# Patient Record
Sex: Female | Born: 1975 | Race: White | Hispanic: No | Marital: Single | Smoking: Current every day smoker
Health system: Southern US, Community
[De-identification: ages and names within clinical notes are randomized; demographics above are authoritative.]

## PROBLEM LIST (undated history)

## (undated) DIAGNOSIS — K219 Gastro-esophageal reflux disease without esophagitis: Secondary | ICD-10-CM

## (undated) DIAGNOSIS — T7840XA Allergy, unspecified, initial encounter: Secondary | ICD-10-CM

## (undated) DIAGNOSIS — J45909 Unspecified asthma, uncomplicated: Secondary | ICD-10-CM

## (undated) HISTORY — DX: Allergy, unspecified, initial encounter: T78.40XA

## (undated) HISTORY — DX: Unspecified asthma, uncomplicated: J45.909

## (undated) HISTORY — DX: Gastro-esophageal reflux disease without esophagitis: K21.9

---

## 2013-06-15 ENCOUNTER — Ambulatory Visit (INDEPENDENT_AMBULATORY_CARE_PROVIDER_SITE_OTHER): Payer: BC Managed Care – PPO | Admitting: Family Medicine

## 2013-06-15 ENCOUNTER — Other Ambulatory Visit: Payer: Self-pay | Admitting: Family Medicine

## 2013-06-15 ENCOUNTER — Ambulatory Visit: Payer: BC Managed Care – PPO

## 2013-06-15 VITALS — BP 115/82 | HR 99 | Temp 98.6°F | Resp 18 | Ht 67.5 in | Wt 372.0 lb

## 2013-06-15 DIAGNOSIS — R05 Cough: Secondary | ICD-10-CM

## 2013-06-15 DIAGNOSIS — J209 Acute bronchitis, unspecified: Secondary | ICD-10-CM

## 2013-06-15 DIAGNOSIS — R1011 Right upper quadrant pain: Secondary | ICD-10-CM

## 2013-06-15 DIAGNOSIS — R059 Cough, unspecified: Secondary | ICD-10-CM

## 2013-06-15 DIAGNOSIS — D72829 Elevated white blood cell count, unspecified: Secondary | ICD-10-CM

## 2013-06-15 DIAGNOSIS — R110 Nausea: Secondary | ICD-10-CM

## 2013-06-15 LAB — POCT URINALYSIS DIPSTICK
Bilirubin, UA: NEGATIVE
Blood, UA: NEGATIVE
Glucose, UA: NEGATIVE
Leukocytes, UA: NEGATIVE
Nitrite, UA: NEGATIVE
Protein, UA: NEGATIVE
Spec Grav, UA: >=1.03
Urobilinogen, UA: 0.2
pH, UA: 5.5

## 2013-06-15 LAB — POCT UA - MICROSCOPIC ONLY
Casts, Ur, LPF, POC: NEGATIVE
Crystals, Ur, HPF, POC: NEGATIVE
Mucus, UA: POSITIVE
Yeast, UA: NEGATIVE

## 2013-06-15 LAB — POCT CBC
Granulocyte percent: 71.4 %G (ref 37–80)
HCT, POC: 43 % (ref 37.7–47.9)
Hemoglobin: 13.2 g/dL (ref 12.2–16.2)
Lymph, poc: 2.4 (ref 0.6–3.4)
MCH, POC: 28.9 pg (ref 27–31.2)
MCHC: 30.7 g/dL — AB (ref 31.8–35.4)
MCV: 94.2 fL (ref 80–97)
MID (cbc): 0.7 (ref 0–0.9)
MPV: 9.7 fL (ref 0–99.8)
POC Granulocyte: 7.9 — AB (ref 2–6.9)
POC LYMPH PERCENT: 22.2 %L (ref 10–50)
POC MID %: 6.4 %M (ref 0–12)
Platelet Count, POC: 362 10*3/uL (ref 142–424)
RBC: 4.56 M/uL (ref 4.04–5.48)
RDW, POC: 16.3 %
WBC: 11 10*3/uL — AB (ref 4.6–10.2)

## 2013-06-15 MED ORDER — HYDROCODONE-HOMATROPINE 5-1.5 MG/5ML PO SYRP: 5.0000 mL | ORAL_SOLUTION | Freq: Every evening | ORAL | Status: DC | PRN

## 2013-06-15 MED ORDER — BENZONATATE 100 MG PO CAPS: 200.0000 mg | ORAL_CAPSULE | Freq: Two times a day (BID) | ORAL | Status: DC | PRN

## 2013-06-15 MED ORDER — AZITHROMYCIN 250 MG PO TABS: ORAL_TABLET | ORAL | Status: DC

## 2013-06-15 NOTE — Patient Instructions (Signed)

## 2013-06-15 NOTE — Progress Notes (Signed)
[facility]:  Office Visit  Chief Complaint:  Chief Complaint  Patient presents with  . Abdominal Pain    ruq    HPI: Margaret Gomez is a 37 y.o. female who is here for   Cough and chest congestion Saturday, quit smoking 1 week ago No fevers or chills Not really coughing up anything, no sputum prodcution No facial or ear pain.  Today she has acute  RUQ abd pain, dull 7/10 pain, she was sitting at her mom's and it started.  Went over to her apt and just got worse, laid down.  Sweating No nausea, fevers, vomitus, chills, rash, diarrhea, blood in urine or stool. Still having flatus, bm , no diarrhea No recent travels, med She works in nursing home No prior h/o gallstones, dm, or hyperlipidemia Ate roast beef sandwich and two tacos the day before  Past Medical History  Diagnosis Date  . Allergy   . Asthma    No past surgical history on file. History   Social History  . Marital Status: Single    Spouse Name: N/A    Number of Children: N/A  . Years of Education: N/A   Social History Main Topics  . Smoking status: Current Every Day Smoker  . Smokeless tobacco: None  . Alcohol Use: No  . Drug Use: No  . Sexual Activity: No   Other Topics Concern  . None   Social History Narrative  . None   Family History  Problem Relation Age of Onset  . COPD Mother    No Known Allergies Prior to Admission medications   Medication Sig Start Date End Date Taking? Authorizing Provider  albuterol (PROVENTIL HFA;VENTOLIN HFA) 108 (90 BASE) MCG/ACT inhaler Inhale 2 puffs into the lungs every 6 (six) hours as needed for wheezing.   Yes Historical Provider, MD     ROS: The patient denies night sweats, unintentional weight loss, chest pain, palpitations, wheezing, dyspnea on exertion, , vomiting, , dysuria, hematuria, melena, numbness, weakness, or tingling.   All other systems have been reviewed and were otherwise negative with the exception of those  mentioned in the HPI and as above.    PHYSICAL EXAM: Filed Vitals:   06/15/13 1934  BP: 115/82  Pulse: 99  Temp: 98.6 F (37 C)  Resp: 18   Filed Vitals:   06/15/13 1934  Height: 5' 7.5" (1.715 m)  Weight: 372 lb (168.738 kg)   Body mass index is 57.37 kg/(m^2).  General: Alert, no acute distress, obese Caucasian female HEENT:  Normocephalic, atraumatic, oropharynx patent. EOMI, PERRLA Cardiovascular:  Regular rate and rhythm, no rubs murmurs or gallops.  No Carotid bruits, radial pulse intact. No pedal edema.  Respiratory: Clear to auscultation bilaterally.  No wheezes, rales, or rhonchi.  No cyanosis, no use of accessory musculature GI: No organomegaly, abdomen is soft and minimally -Ruq rib cage tenderness, no guarding or rebound, positive bowel sounds.  No masses. Skin: No rashes. Neurologic: Facial musculature symmetric. Psychiatric: Patient is appropriate throughout our interaction. Lymphatic: No cervical lymphadenopathy Musculoskeletal: Gait intact.   LABS: Results for orders placed in visit on 06/15/13  COMPREHENSIVE METABOLIC PANEL      Result Value Range   Sodium 142  135 - 145 mEq/L   Potassium 4.3  3.5 - 5.3 mEq/L   Chloride 106  96 - 112 mEq/L   CO2 28  19 - 32 mEq/L   Glucose, Bld 102 (*) 70 - 99 mg/dL   BUN  10  6 - 23 mg/dL   Creat 1.61  0.96 - 0.45 mg/dL   Total Bilirubin 0.6  0.3 - 1.2 mg/dL   Alkaline Phosphatase 128 (*) 39 - 117 U/L   AST 170 (*) 0 - 37 U/L   ALT 65 (*) 0 - 35 U/L   Total Protein 6.9  6.0 - 8.3 g/dL   Albumin 4.1  3.5 - 5.2 g/dL   Calcium 9.7  8.4 - 40.9 mg/dL  LIPASE      Result Value Range   Lipase 22  0 - 75 U/L  POCT UA - MICROSCOPIC ONLY      Result Value Range   WBC, Ur, HPF, POC 4-5     RBC, urine, microscopic 0-2     Bacteria, U Microscopic 3+     Mucus, UA pos     Epithelial cells, urine per micros 4-6     Crystals, Ur, HPF, POC neg     Casts, Ur, LPF, POC neg     Yeast, UA neg    POCT URINALYSIS DIPSTICK       Result Value Range   Color, UA yellow     Clarity, UA clear     Glucose, UA neg     Bilirubin, UA neg     Ketones, UA trace     Spec Grav, UA >=1.030     Blood, UA neg     pH, UA 5.5     Protein, UA neg     Urobilinogen, UA 0.2     Nitrite, UA neg     Leukocytes, UA Negative    POCT CBC      Result Value Range   WBC 11.0 (*) 4.6 - 10.2 K/uL   Lymph, poc 2.4  0.6 - 3.4   POC LYMPH PERCENT 22.2  10 - 50 %L   MID (cbc) 0.7  0 - 0.9   POC MID % 6.4  0 - 12 %M   POC Granulocyte 7.9 (*) 2 - 6.9   Granulocyte percent 71.4  37 - 80 %G   RBC 4.56  4.04 - 5.48 M/uL   Hemoglobin 13.2  12.2 - 16.2 g/dL   HCT, POC 81.1  91.4 - 47.9 %   MCV 94.2  80 - 97 fL   MCH, POC 28.9  27 - 31.2 pg   MCHC 30.7 (*) 31.8 - 35.4 g/dL   RDW, POC 78.2     Platelet Count, POC 362  142 - 424 K/uL   MPV 9.7  0 - 99.8 fL     EKG/XRAY:   Primary read interpreted by Dr. Conley Rolls at [facility]. No acute cardiopulmonary process No obstruction, no appreciable e/o renal stones No free air   ASSESSMENT/PLAN: Encounter Diagnoses  Name Primary?  . Abdominal pain, right upper quadrant Yes  . Cough   . Acute bronchitis   . Leukocytosis, unspecified   . Nausea alone    Tylenol, Motrin prn for abd pain Hycodan, Z pack for bronchitis Tessalon perles albuterol Labs pending: CMP, Lipase IF sxs worsen will get advance imaging study or go to ER prn  IF abd pain worsens may be ? Possible acute cholecysitis F/u with labs  Spoke with patient regarding labs, elevated liver enzymes, RUQ pain gone but has moved to middle of her stomach, she has had diarrhea and stool looks green vs tan. She feels about the same. Still coughing.  She ahs had Hep B vaccine. Agreed to allow me  to add Acute hepatitis labs.  Gross sideeffects, risk and benefits, and alternatives of medications d/w patient. Patient is aware that all medications have potential sideeffects and we are unable to predict every sideeffect or drug-drug interaction that  may occur.  LE, THAO PHUONG, DO 06/17/2013 10:30 AM

## 2013-06-16 LAB — COMPREHENSIVE METABOLIC PANEL WITH GFR
ALT: 65 U/L — ABNORMAL HIGH (ref 0–35)
AST: 170 U/L — ABNORMAL HIGH (ref 0–37)
Albumin: 4.1 g/dL (ref 3.5–5.2)
Alkaline Phosphatase: 128 U/L — ABNORMAL HIGH (ref 39–117)
BUN: 10 mg/dL (ref 6–23)
CO2: 28 meq/L (ref 19–32)
Calcium: 9.7 mg/dL (ref 8.4–10.5)
Chloride: 106 meq/L (ref 96–112)
Creat: 0.79 mg/dL (ref 0.50–1.10)
Glucose, Bld: 102 mg/dL — ABNORMAL HIGH (ref 70–99)
Potassium: 4.3 meq/L (ref 3.5–5.3)
Sodium: 142 meq/L (ref 135–145)
Total Bilirubin: 0.6 mg/dL (ref 0.3–1.2)
Total Protein: 6.9 g/dL (ref 6.0–8.3)

## 2013-06-16 LAB — LIPASE: Lipase: 22 U/L (ref 0–75)

## 2013-06-17 ENCOUNTER — Telehealth: Payer: Self-pay

## 2013-06-17 LAB — HEPATITIS PANEL, ACUTE
HCV Ab: NEGATIVE
Hep A IgM: NONREACTIVE
Hep B C IgM: NONREACTIVE
Hepatitis B Surface Ag: NEGATIVE

## 2013-06-17 NOTE — Telephone Encounter (Signed)
CT Abd/Pelvis was pre-certed #[id number] and scheduled at [facility] [address] on 06/18/13 at 3:45. Must p/up contrast at least 2 hrs prior and no solid foods for 4 hrs before that. Notified pt of appt and inst's. She agreed. I gave [place]  Img Dr Irwin Brakeman number to call report.

## 2013-06-18 ENCOUNTER — Other Ambulatory Visit: Payer: Self-pay

## 2013-06-18 ENCOUNTER — Ambulatory Visit
Admission: RE | Admit: 2013-06-18 | Discharge: 2013-06-18 | Disposition: A | Discharge status: Home / Self Care | Payer: BC Managed Care – PPO | Source: Ambulatory Visit | Attending: Family Medicine | Admitting: Family Medicine

## 2013-06-18 ENCOUNTER — Telehealth: Payer: Self-pay | Admitting: Radiology

## 2013-06-18 DIAGNOSIS — D72829 Elevated white blood cell count, unspecified: Secondary | ICD-10-CM

## 2013-06-18 DIAGNOSIS — R748 Abnormal levels of other serum enzymes: Secondary | ICD-10-CM

## 2013-06-18 DIAGNOSIS — R109 Unspecified abdominal pain: Secondary | ICD-10-CM

## 2013-06-18 DIAGNOSIS — R7989 Other specified abnormal findings of blood chemistry: Secondary | ICD-10-CM

## 2013-06-18 MED ORDER — IOHEXOL 300 MG/ML  SOLN
125.0000 mL | Freq: Once | INTRAMUSCULAR | Status: AC | PRN
  Administered 2013-06-18: 125 mL via INTRAVENOUS

## 2013-06-18 NOTE — Telephone Encounter (Signed)
Margaret Gomez from [facility] called with the report CT Abdomen / Pelvis - see report in chart.

## 2013-06-19 NOTE — Telephone Encounter (Addendum)
Spoke with patient about CT results She is not having any pain right now, but not feeling better since has not taken any abx for her bronchitis Was afraid of liver enzymes being elevated, she has not taken her meds Advise that CT of abd and acute hepatitis panel normal except for gallstones, not acute chole. IF she has more pain or worsenign sxs then let me know so we can refer her to gen surgery She will let me know if she is worse, she can take her meds I will get repeat liver enzymes in 1 week, labs only visit

## 2013-06-21 ENCOUNTER — Telehealth: Payer: Self-pay

## 2013-06-21 DIAGNOSIS — K8020 Calculus of gallbladder without cholecystitis without obstruction: Secondary | ICD-10-CM

## 2013-06-21 NOTE — Telephone Encounter (Signed)
Lot of pain Throwing up all night  Unable to eat   [id number]

## 2013-06-22 ENCOUNTER — Telehealth: Payer: Self-pay | Admitting: Radiology

## 2013-06-22 NOTE — Telephone Encounter (Signed)
duplicate

## 2013-06-22 NOTE — Telephone Encounter (Signed)
Referral made to general surgery. They will call her, Lupita Leash will try to get this set up soon, patient advised.

## 2013-06-24 ENCOUNTER — Other Ambulatory Visit: Payer: Self-pay

## 2013-06-30 ENCOUNTER — Encounter (INDEPENDENT_AMBULATORY_CARE_PROVIDER_SITE_OTHER): Payer: Self-pay | Admitting: Surgery

## 2013-06-30 ENCOUNTER — Telehealth (INDEPENDENT_AMBULATORY_CARE_PROVIDER_SITE_OTHER): Payer: Self-pay | Admitting: Surgery

## 2013-06-30 ENCOUNTER — Ambulatory Visit (INDEPENDENT_AMBULATORY_CARE_PROVIDER_SITE_OTHER): Payer: BC Managed Care – PPO | Admitting: Surgery

## 2013-06-30 VITALS — BP 130/84 | HR 72 | Temp 98.7°F | Resp 16 | Ht 66.0 in | Wt 366.2 lb

## 2013-06-30 DIAGNOSIS — K8010 Calculus of gallbladder with chronic cholecystitis without obstruction: Secondary | ICD-10-CM

## 2013-06-30 NOTE — Progress Notes (Signed)
Patient ID: Margaret Gomez, female   DOB: [birth year], 37 y.o.   MRN: [id number]  Chief Complaint  Patient presents with  . Abdominal Pain    gallstones    HPI Margaret Gomez is a 37 y.o. female.  Referred by Dr. Hamilton Capri for evaluation of gallstones  Abdominal Pain Associated symptoms: diarrhea, nausea and vomiting   Associated symptoms: no chest pain, no chills, no constipation, no cough, no fever, no hematuria, no sore throat and no vaginal bleeding    This is a 37 yo female with morbid obesity, but a weight loss of 50 lbs over the last several months, that presents after a single episode of severe right upper quadrant pain that radiated across her upper abdomen and around to her back.  This was associated with nausea, vomiting, and diarrhea, as well as abdominal bloating.  The pain resolved after a few days of eating a bland diet, but she has had one mild recurrence after eating a greasy meal.  Currently she is asymptomatic as long as she sticks to a low-fat diet.  She had elevated alkaline phosphatase and AST/ALT, but normal bilirubin.  WBC was also elevated at 11.0.   Past Medical History  Diagnosis Date  . Allergy   . Asthma   . GERD (gastroesophageal reflux disease)   Morbid obesity  History reviewed. No pertinent past surgical history.  Family History  Problem Relation Age of Onset  . COPD Mother     Social History History  Substance Use Topics  . Smoking status: Current Every Day Smoker -- 0.50 packs/day    Types: Cigarettes  . Smokeless tobacco: Not on file  . 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 albuterol (PROVENTIL HFA;VENTOLIN HFA) 108 (90 BASE) MCG/ACT inhaler Inhale 2 puffs into the lungs every 6 (six) hours as needed for wheezing.      Marland Kitchen azithromycin (ZITHROMAX) 250 MG tablet 2 tabs po now then 1 tab po daily for the next 4 days  6  tablet  0  . benzonatate (TESSALON) 100 MG capsule Take 2 capsules (200 mg total) by mouth 2 (two) times daily as needed for cough.  30 capsule  0  . HYDROcodone-homatropine (HYCODAN) 5-1.5 MG/5ML syrup Take 5 mLs by mouth at bedtime as needed for cough.  120 mL  0   No current facility-administered medications for this visit.    Review of Systems Review of Systems  Constitutional: Negative for fever, chills and unexpected weight change.  HENT: Negative for congestion, hearing loss, sore throat, trouble swallowing and voice change.   Eyes: Negative for visual disturbance.  Respiratory: Negative for cough and wheezing.   Cardiovascular: Negative for chest pain, palpitations and leg swelling.  Gastrointestinal: Positive for nausea, vomiting, abdominal pain, diarrhea and abdominal distention. Negative for constipation, blood in stool and anal bleeding.  Genitourinary: Negative for hematuria, vaginal bleeding and difficulty urinating.  Musculoskeletal: Negative for arthralgias.  Skin: Negative for rash and wound.  Neurological: Negative for seizures, syncope and headaches.  Hematological: Negative for adenopathy. Does not bruise/bleed easily.  Psychiatric/Behavioral: Negative for confusion.    Blood pressure 130/84, pulse 72, temperature 98.7 F (37.1 C), temperature source Temporal, resp. rate 16, height 5\' 6"  (1.676 m), weight 366 lb 3.2 oz (166.107 kg), last menstrual period 06/13/2013.  Physical Exam Physical Exam WDWN in NAD HEENT:  EOMI, sclera  anicteric Neck:  No masses, no thyromegaly Lungs:  CTA bilaterally; normal respiratory effort CV:  Regular rate and rhythm; no murmurs Abd:  +bowel sounds, obese, minimal RUQ tenderness; no masses Ext:  Well-perfused; no edema Skin:  Warm, dry; no sign of jaundice  Data Reviewed Ct Abdomen Pelvis W Contrast  06/18/2013   CLINICAL DATA:  Right upper quadrant pain, elevated LFTs, recent weight loss  EXAM: CT ABDOMEN AND PELVIS WITH  CONTRAST  TECHNIQUE: Multidetector CT imaging of the abdomen and pelvis was performed using the standard protocol following bolus administration of intravenous contrast.  CONTRAST:  OMNIPAQUE IOHEXOL 300 MG/ML  SOLN  COMPARISON:  None.  FINDINGS: The lung bases are free of acute infiltrate or sizable effusion.  The liver is homogeneous in attenuation without focal mass. The gallbladder is decompressed but demonstrates findings of cholelithiasis. No pericholecystic fluid is seen. The spleen, adrenal glands and pancreas are within normal limits. A duodenal diverticulum is noted. The appendix is well visualized and within normal limits. The bladder is decompressed. No pelvic mass lesion or sidewall abnormality is noted. The bony structures are within normal limits.  IMPRESSION: Cholelithiasis in a decompressed gallbladder. No acute abnormality is noted.   Electronically Signed   By: Alcide Clever M.D.   On: 06/18/2013 16:30   Dg Abd Acute W/chest  06/15/2013   CLINICAL DATA:  abdominal pain  EXAM: ACUTE ABDOMEN SERIES (ABDOMEN 2 VIEW & CHEST 1 VIEW)  COMPARISON:  None.  FINDINGS: PA chest: There is subsegmental atelectasis in the left base. There is no edema or consolidation. Heart size and pulmonary vascularity are normal. No adenopathy.  Supine and upright abdomen: There is moderate stool throughout the colon. The bowel gas pattern is unremarkable. No obstruction or free air is appreciated on this study. Liver and spleen appear prominent. There are apparent phleboliths in the pelvis.  IMPRESSION: Prominent liver and spleen. Bowel gas pattern unremarkable. No edema or consolidation.   Electronically Signed   By: Bretta Bang M.D.   On: 06/15/2013 21:56    Lab Results  Component Value Date   WBC 11.0* 06/15/2013   HGB 13.2 06/15/2013   HCT 43.0 06/15/2013   MCV 94.2 06/15/2013   Lab Results  Component Value Date   CREATININE 0.79 06/15/2013   BUN 10 06/15/2013   NA 142 06/15/2013   K 4.3  06/15/2013   CL 106 06/15/2013   CO2 28 06/15/2013   Lab Results  Component Value Date   ALT 65* 06/15/2013   AST 170* 06/15/2013   ALKPHOS 128* 06/15/2013   BILITOT 0.6 06/15/2013   Lab Results  Component Value Date   LIPASE 22 06/15/2013     Assessment    Chronic calculus cholecystitis - possible choledocholithiasis, but currently no signs of CBD obstruction     Plan    Laparoscopic cholecystectomy with intraoperative cholangiogram. The surgical procedure has been discussed with the patient.  Potential risks, benefits, alternative treatments, and expected outcomes have been explained.  All of the patient's questions at this time have been answered.  The likelihood of reaching the patient's treatment goal is good.  The patient understand the proposed surgical procedure and wishes to proceed.         Briston Lax K. 06/30/2013, 3:40 PM

## 2013-06-30 NOTE — Telephone Encounter (Signed)
Pt will call back when can schedule surgery / made aware of [facility] financial obligation  High deductible insurance plan /

## 2014-03-19 ENCOUNTER — Ambulatory Visit (INDEPENDENT_AMBULATORY_CARE_PROVIDER_SITE_OTHER): Payer: BC Managed Care – PPO | Admitting: Family Medicine

## 2014-03-19 DIAGNOSIS — R748 Abnormal levels of other serum enzymes: Secondary | ICD-10-CM

## 2014-03-19 LAB — COMPREHENSIVE METABOLIC PANEL
ALT: 12 U/L (ref 0–35)
AST: 11 U/L (ref 0–37)
Albumin: 4 g/dL (ref 3.5–5.2)
Alkaline Phosphatase: 70 U/L (ref 39–117)
BUN: 9 mg/dL (ref 6–23)
CALCIUM: 9.1 mg/dL (ref 8.4–10.5)
CO2: 23 mEq/L (ref 19–32)
CREATININE: 0.64 mg/dL (ref 0.50–1.10)
Chloride: 106 mEq/L (ref 96–112)
Glucose, Bld: 87 mg/dL (ref 70–99)
Potassium: 4.2 mEq/L (ref 3.5–5.3)
Sodium: 138 mEq/L (ref 135–145)
TOTAL PROTEIN: 6.4 g/dL (ref 6.0–8.3)
Total Bilirubin: 0.5 mg/dL (ref 0.2–1.2)

## 2014-03-19 LAB — LIPID PANEL
Cholesterol: 164 mg/dL (ref 0–200)
HDL: 44 mg/dL (ref >39)
LDL CALC: 102 mg/dL — AB (ref 0–99)
TRIGLYCERIDES: 92 mg/dL (ref <150)
Total CHOL/HDL Ratio: 3.7 Ratio
VLDL: 18 mg/dL (ref 0–40)

## 2014-03-19 LAB — TSH: TSH: 2.245 u[IU]/mL (ref 0.350–4.500)

## 2014-03-19 NOTE — Patient Instructions (Addendum)
Work hard on weight loss and exercise as discussed  Return for a followup in 4-6 months.

## 2014-03-19 NOTE — Progress Notes (Signed)
Subjective: Patient was here 9 months ago and had some labs done. There was some liver enzyme elevation at that time. She is morbidly obese and has been trying to work at that. She would like to have physician recommendation regarding her weight loss and exercise. She does have access to a gym membership and her trainer is a dietitian also.  Objective: Morbidly obese. Chest clear. Heart regular without murmurs. No thyromegaly.  Assessment: Morbid obesity History of elevated liver enzymes  Plan: Right a letter of reference instructing her to work on the exercise and weight loss. This is intended to help her cover the cost of the exercise in program.  Will check lab work due to her previous abnormal labs.  Return as needed

## 2014-03-20 ENCOUNTER — Encounter: Payer: Self-pay | Admitting: Family Medicine

## 2014-03-21 ENCOUNTER — Encounter: Payer: Self-pay | Admitting: *Deleted

## 2014-08-28 ENCOUNTER — Ambulatory Visit (INDEPENDENT_AMBULATORY_CARE_PROVIDER_SITE_OTHER): Payer: BLUE CROSS/BLUE SHIELD | Admitting: Emergency Medicine

## 2014-08-28 ENCOUNTER — Ambulatory Visit (INDEPENDENT_AMBULATORY_CARE_PROVIDER_SITE_OTHER): Payer: BLUE CROSS/BLUE SHIELD

## 2014-08-28 VITALS — BP 158/82 | HR 93 | Temp 98.2°F | Resp 20 | Ht 66.0 in | Wt 374.0 lb

## 2014-08-28 DIAGNOSIS — R062 Wheezing: Secondary | ICD-10-CM

## 2014-08-28 DIAGNOSIS — R059 Cough, unspecified: Secondary | ICD-10-CM

## 2014-08-28 DIAGNOSIS — J189 Pneumonia, unspecified organism: Secondary | ICD-10-CM

## 2014-08-28 DIAGNOSIS — R05 Cough: Secondary | ICD-10-CM

## 2014-08-28 LAB — POCT CBC
Granulocyte percent: 61 %G (ref 37–80)
HEMATOCRIT: 42.8 % (ref 37.7–47.9)
Hemoglobin: 13.2 g/dL (ref 12.2–16.2)
Lymph, poc: 3.8 — AB (ref 0.6–3.4)
MCH, POC: 28.3 pg (ref 27–31.2)
MCHC: 30.8 g/dL — AB (ref 31.8–35.4)
MCV: 91.9 fL (ref 80–97)
MID (CBC): 0.3 (ref 0–0.9)
MPV: 8.5 fL (ref 0–99.8)
POC GRANULOCYTE: 6.5 (ref 2–6.9)
POC LYMPH PERCENT: 35.8 %L (ref 10–50)
POC MID %: 3.2 %M (ref 0–12)
Platelet Count, POC: 279 10*3/uL (ref 142–424)
RBC: 4.65 M/uL (ref 4.04–5.48)
RDW, POC: 16.5 %
WBC: 10.6 10*3/uL — AB (ref 4.6–10.2)

## 2014-08-28 MED ORDER — ALBUTEROL SULFATE HFA 108 (90 BASE) MCG/ACT IN AERS: 2.0000 | INHALATION_SPRAY | RESPIRATORY_TRACT | Status: AC | PRN

## 2014-08-28 MED ORDER — AZITHROMYCIN 250 MG PO TABS: ORAL_TABLET | ORAL | Status: DC

## 2014-08-28 MED ORDER — ALBUTEROL SULFATE (2.5 MG/3ML) 0.083% IN NEBU
2.5000 mg | INHALATION_SOLUTION | Freq: Once | RESPIRATORY_TRACT | Status: AC
Start: 2014-08-28 — End: 2014-08-28
  Administered 2014-08-28: 2.5 mg via RESPIRATORY_TRACT

## 2014-08-28 MED ORDER — BENZONATATE 100 MG PO CAPS: 100.0000 mg | ORAL_CAPSULE | Freq: Three times a day (TID) | ORAL | Status: DC | PRN

## 2014-08-28 MED ORDER — OMEPRAZOLE 40 MG PO CPDR: 40.0000 mg | DELAYED_RELEASE_CAPSULE | Freq: Every day | ORAL | Status: DC

## 2014-08-28 NOTE — Progress Notes (Addendum)
Subjective:  This chart was scribed for Earl LitesSteve Raylyn Carton, MD by Haywood PaoNadim Abu Hashem, ED Scribe at [facility].The patient was seen in exam room 09 and the patient's care was started at 1:35 PM.   Patient ID: Margaret Gomez, female    DOB: 05/30/1976, 39 y.o.   MRN: [id number] Chief Complaint  Patient presents with  . Cough  . Shortness of Breath  . Nasal Congestion   HPI HPI Comments: Margaret Gomez is a 39 y.o. female [facility] complaining of a non productive dry cough, onset 2 weeks ago. Since onset her symptoms have progressively worsened. Pt initially thought this was caused by her allergies. She has intermittent sore throat, chest congestions, SOB and wheezing, acid reflux more than normal. She has used mucinex, and her inhaler for relief. She last used her inhaler at 10:30 AM today. Pt reports the last few days her inhaler has not helped. 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 Pt denies ear pain and fever.  Patient Active Problem List   Diagnosis Date Noted  . Chronic cholecystitis with calculus 06/30/2013   Past Medical History  Diagnosis Date  . Allergy   . Asthma   . GERD (gastroesophageal reflux disease)    History reviewed. No pertinent past surgical history. No Known Allergies Prior to Admission medications   Medication Sig Start Date End Date Taking? Authorizing Provider  albuterol (PROVENTIL HFA;VENTOLIN HFA) 108 (90 BASE) MCG/ACT inhaler Inhale 2 puffs into the lungs every 6 (six) hours as needed for wheezing.   Yes Historical Provider, MD  Multiple Vitamins-Minerals (MULTI COMPLETE PO) Take by mouth.   Yes Historical Provider, MD  Naproxen Sodium (ALEVE) 220 MG CAPS Take by mouth.   Yes Historical Provider, MD  azithromycin (ZITHROMAX) 250 MG tablet 2 tabs po now then 1 tab po daily for the next 4 days Patient not taking: Reported on 08/28/2014 06/15/13   Thao P Le, DO  benzonatate (TESSALON) 100 MG  capsule Take 2 capsules (200 mg total) by mouth 2 (two) times daily as needed for cough. Patient not taking: Reported on 08/28/2014 06/15/13   Thao P Le, DO  HYDROcodone-homatropine (HYCODAN) 5-1.5 MG/5ML syrup Take 5 mLs by mouth at bedtime as needed for cough. Patient not taking: Reported on 08/28/2014 06/15/13   Thao P Le, DO  OVER THE COUNTER MEDICATION Take 2 tablets by mouth daily. hydroxycut    Historical Provider, MD   History   Social History  . Marital Status: Single    Spouse Name: N/A    Number of Children: N/A  . Years of Education: N/A   Occupational History  . Not on file.   Social History Main Topics  . Smoking status: Current Every Day Smoker -- 0.50 packs/day    Types: Cigarettes  . Smokeless tobacco: Not on file  . Alcohol Use: No  . Drug Use: No  . Sexual Activity: No   Other Topics Concern  . Not on file   Social History Narrative   Review of Systems  Constitutional: Negative for fever.  HENT: Positive for congestion and sore throat. Negative for ear pain.   Respiratory: Positive for shortness of breath and wheezing.        Objective:  BP 158/82 mmHg  Pulse 93  Temp(Src) 98.2 F (36.8 C) (Oral)  Resp 20  Ht 5\' 6"  (1.676 m)  Wt 374 lb (169.645 kg)  BMI  60.39 kg/m2  SpO2 98%  Physical Exam  Constitutional: She is oriented to person, place, and time. She appears well-developed and well-nourished. No distress.  Alert, cooperative and morbidly obese.  HENT:  Head: Normocephalic and atraumatic.  Eyes: EOM are normal.  Neck: Normal range of motion.  Cardiovascular: Normal rate.   Pulmonary/Chest: Effort normal.  Croup like cough  Neurological: She is alert and oriented to person, place, and time. No cranial nerve deficit. She exhibits normal muscle tone. Coordination normal.  Skin: Skin is warm and dry.  Psychiatric: She has a normal mood and affect. Her behavior is normal.  Nursing note and vitals reviewed. [facility] reading (PRIMARY) by  Dr. Cleta Alberts  there appears to be a infiltrate in the left lower lobe with fluid in the fissure. This is best seen on the lateral. Results for orders placed or performed in visit on 08/28/14  POCT CBC  Result Value Ref Range   WBC 10.6 (A) 4.6 - 10.2 K/uL   Lymph, poc 3.8 (A) 0.6 - 3.4   POC LYMPH PERCENT 35.8 10 - 50 %L   MID (cbc) 0.3 0 - 0.9   POC MID % 3.2 0 - 12 %M   POC Granulocyte 6.5 2 - 6.9   Granulocyte percent 61.0 37 - 80 %G   RBC 4.65 4.04 - 5.48 M/uL   Hemoglobin 13.2 12.2 - 16.2 g/dL   HCT, POC 16.1 09.6 - 47.9 %   MCV 91.9 80 - 97 fL   MCH, POC 28.3 27 - 31.2 pg   MCHC 30.8 (A) 31.8 - 35.4 g/dL   RDW, POC 04.5 %   Platelet Count, POC 279 142 - 424 K/uL   MPV 8.5 0 - 99.8 fL       Assessment & Plan:  Peak flow before nebulizer was 350; should be 370. Chest x-ray shows a left lower lobe pneumonia. s. Repeat chest x-ray in 14 days. She is placed on a Z-Pak along with Tessalon Perles. She will also have Prilosec 40 mg 1 a day. Her albuterol was refilled..I personally performed the services described in this documentation, which was scribed in my presence. The recorded information has been reviewed and is accurate.

## 2014-09-01 ENCOUNTER — Encounter: Payer: Self-pay | Admitting: Emergency Medicine

## 2014-10-25 ENCOUNTER — Ambulatory Visit (INDEPENDENT_AMBULATORY_CARE_PROVIDER_SITE_OTHER): Payer: BLUE CROSS/BLUE SHIELD | Admitting: Family Medicine

## 2014-10-25 ENCOUNTER — Ambulatory Visit (INDEPENDENT_AMBULATORY_CARE_PROVIDER_SITE_OTHER): Payer: BLUE CROSS/BLUE SHIELD

## 2014-10-25 VITALS — BP 142/86 | HR 106 | Temp 98.8°F | Resp 18 | Ht 67.5 in | Wt 374.8 lb

## 2014-10-25 DIAGNOSIS — J4530 Mild persistent asthma, uncomplicated: Secondary | ICD-10-CM | POA: Diagnosis not present

## 2014-10-25 DIAGNOSIS — R059 Cough, unspecified: Secondary | ICD-10-CM

## 2014-10-25 DIAGNOSIS — J3489 Other specified disorders of nose and nasal sinuses: Secondary | ICD-10-CM

## 2014-10-25 DIAGNOSIS — R05 Cough: Secondary | ICD-10-CM

## 2014-10-25 DIAGNOSIS — R0600 Dyspnea, unspecified: Secondary | ICD-10-CM | POA: Diagnosis not present

## 2014-10-25 DIAGNOSIS — J019 Acute sinusitis, unspecified: Secondary | ICD-10-CM | POA: Diagnosis not present

## 2014-10-25 MED ORDER — AMOXICILLIN-POT CLAVULANATE 875-125 MG PO TABS: 1.0000 | ORAL_TABLET | Freq: Two times a day (BID) | ORAL | Status: DC

## 2014-10-25 MED ORDER — BECLOMETHASONE DIPROPIONATE 80 MCG/ACT IN AERS: 1.0000 | INHALATION_SPRAY | Freq: Two times a day (BID) | RESPIRATORY_TRACT | Status: AC

## 2014-10-25 NOTE — Progress Notes (Signed)
Subjective:  This chart was scribed for Shade FloodJeffrey R Osiel Stick, MD by Charline BillsEssence Howell, ED Scribe. The patient was seen in room 9. Patient's care was started at 11:04 AM.   Patient ID: Margaret Gomez, female    DOB: 05/20/1976, 39 y.o.   MRN: [id number]  Chief Complaint  Patient presents with  . Sinusitis    3 days  . Dizziness    feels like there is fluid in her ears  . Cough    productive- Yellow/green   HPI HPI Comments: Margaret Gomez is a 39 y.o. female, with a h/o asthma, GERD, who presents to the [facility] complaining of persistent productive cough for the past 4 days. Pt was seen for L lower lobe pneumonia on 08/28/14 and treated with zpak, Tessalon Perles and Prilosec for heartburn. Also given albuterol PRN. Follow-up email 4 days later advised pt to return to clinic if not improved back in January.   Today, pt presents with persistent productive cough with yellow/green sputum for the past 4 days. She reports associated sinus pressure, congestion, dizziness, worsened SOB with walking, wheezing. She denies fever. Pt has been treating with TheraFlu and albuterol inhaler x3 daily for 4 days, x1 daily prior. Pt reports that her children have been sick with similar symptoms and her daughter was recently diagnosed with laryngitis.   PCP: Rockne CoonsLE, THAO PHUONG, DO  Patient Active Problem List   Diagnosis Date Noted  . Chronic cholecystitis with calculus 06/30/2013   Past Medical History  Diagnosis Date  . Allergy   . Asthma   . GERD (gastroesophageal reflux disease)    No past surgical history on file. No Known Allergies Prior to Admission medications   Medication Sig Start Date End Date Taking? Authorizing Provider  albuterol (PROVENTIL HFA;VENTOLIN HFA) 108 (90 BASE) MCG/ACT inhaler Inhale 2 puffs into the lungs every 4 (four) hours as needed for wheezing or shortness of breath (cough, shortness of breath or wheezing.). 08/28/14  Yes Collene GobbleSteven A Daub, MD    Multiple Vitamins-Minerals (MULTI COMPLETE PO) Take by mouth.   Yes Historical Provider, MD  Naproxen Sodium (ALEVE) 220 MG CAPS Take by mouth.   Yes Historical Provider, MD  omeprazole (PRILOSEC) 40 MG capsule Take 1 capsule (40 mg total) by mouth daily. 08/28/14  Yes Collene GobbleSteven A Daub, MD  albuterol (PROVENTIL HFA;VENTOLIN HFA) 108 (90 BASE) MCG/ACT inhaler Inhale 2 puffs into the lungs every 6 (six) hours as needed for wheezing.    Historical Provider, MD  benzonatate (TESSALON) 100 MG capsule Take 1-2 capsules (100-200 mg total) by mouth 3 (three) times daily as needed for cough. Patient not taking: Reported on 10/25/2014 08/28/14   Collene GobbleSteven A Daub, MD  HYDROcodone-homatropine [facility](HYCODAN) 5-1.5 MG/5ML syrup Take 5 mLs by mouth at bedtime as needed for cough. Patient not taking: Reported on 08/28/2014 06/15/13   Thao P Le, DO  OVER THE COUNTER MEDICATION Take 2 tablets by mouth daily. hydroxycut    Historical Provider, MD   History   Social History  . Marital Status: Single    Spouse Name: N/A  . Number of Children: N/A  . Years of Education: N/A   Occupational History  . Not on file.   Social History Main Topics  . Smoking status: Current Every Day Smoker -- 0.50 packs/day    Types: Cigarettes  . Smokeless tobacco: Not on file  . Alcohol Use: No  . Drug Use: No  . Sexual Activity: No  Other Topics Concern  . Not on file   Social History Narrative   Review of Systems  Constitutional: Negative for fever.  HENT: Positive for congestion and sinus pressure.   Respiratory: Positive for cough, shortness of breath and wheezing.   Neurological: Positive for dizziness.      Objective:   Physical Exam  Constitutional: She is oriented to person, place, and time. She appears well-developed and well-nourished. No distress.  HENT:  Head: Normocephalic and atraumatic.  Right Ear: Hearing, tympanic membrane, external ear and ear canal normal.  Left Ear: Hearing, tympanic membrane, external  ear and ear canal normal.  Nose: Right sinus exhibits maxillary sinus tenderness (mild). Right sinus exhibits no frontal sinus tenderness. Left sinus exhibits maxillary sinus tenderness (mild). Left sinus exhibits no frontal sinus tenderness.  Mouth/Throat: Oropharynx is clear and moist. No oropharyngeal exudate.  Eyes: Conjunctivae and EOM are normal. Pupils are equal, round, and reactive to light.  Cardiovascular: Normal rate, regular rhythm, normal heart sounds and intact distal pulses.  Exam reveals no gallop and no friction rub.   No murmur heard. Pulmonary/Chest: Effort normal. No respiratory distress. She has no wheezes. She has no rhonchi.  Distant breath sounds.   Lymphadenopathy:    She has no cervical adenopathy.  Neurological: She is alert and oriented to person, place, and time.  Skin: Skin is warm and dry. No rash noted.  Psychiatric: She has a normal mood and affect. Her behavior is normal.  Vitals reviewed.  Filed Vitals:   10/25/14 1007  BP: 142/86  Pulse: 106  Temp: 98.8 F (37.1 C)  TempSrc: Oral  Resp: 18  Height: 5' 7.5" (1.715 m)  Weight: 374 lb 12.8 oz (170.008 kg)  SpO2: 98%   [facility] reading (PRIMARY) by  Dr. Neva Seat: CXR- Few increased perihilar markings without discrete infiltrate. No acute findings.    Assessment & Plan:   Margaret Gomez is a 39 y.o. female Asthma, mild persistent, uncomplicated - Plan: beclomethasone (QVAR) 80 MCG/ACT inhaler, DG Chest 2 View  Cough - Plan: DG Chest 2 View, amoxicillin-clavulanate (AUGMENTIN) 875-125 MG per tablet  Sinus pressure - Plan: DG Chest 2 View, amoxicillin-clavulanate (AUGMENTIN) 875-125 MG per tablet  Dyspnea - Plan: DG Chest 2 View  Acute sinusitis, recurrence not specified, unspecified location - Plan: amoxicillin-clavulanate (AUGMENTIN) 875-125 MG per tablet  Suspected URi with sinus pressure, PND and cough. Underlying asthma with persistent use of albuterol even prior to current illness.  Prior  PNA, but not seen on XR today.   -add Qvar for asthma, cont albuterol if needed - but clear today. - recheck asthma in 1 month  -sx care with saline ns, mucinex, but if not improving sinus sx's this week - fill Rx Augmentin.    -rtc/er precautions given if worse.   Meds ordered this encounter  Medications  . beclomethasone (QVAR) 80 MCG/ACT inhaler    Sig: Inhale 1 puff into the lungs 2 (two) times daily.    Dispense:  1 Inhaler    Refill:  3  . amoxicillin-clavulanate (AUGMENTIN) 875-125 MG per tablet    Sig: Take 1 tablet by mouth 2 (two) times daily.    Dispense:  20 tablet    Refill:  0   Patient Instructions  Start Qvar for asthma symptoms. Follow up with Dr. Conley Rolls in next month. Albuterol if needed for wheezing - return here if having to use albuterol persistently more than 2-3 times per day.  Saline nasal spray atleast 4  times per day, over the counter mucinex or mucinex DM (IF this causes more wheezing - stop mucinex), drink plenty of fluids.  If sinus symptoms not improving in next few days - can start augmentin as prescribed. Return to the clinic or go to the nearest emergency room if any of your symptoms worsen or new symptoms occur.  Upper Respiratory Infection, Adult An upper respiratory infection (URI) is also sometimes known as the common cold. The upper respiratory tract includes the nose, sinuses, throat, trachea, and bronchi. Bronchi are the airways leading to the lungs. Most people improve within 1 week, but symptoms can last up to 2 weeks. A residual cough may last even longer.  CAUSES Many different viruses can infect the tissues lining the upper respiratory tract. The tissues become irritated and inflamed and often become very moist. Mucus production is also common. A cold is contagious. You can easily spread the virus to others by oral contact. This includes kissing, sharing a glass, coughing, or sneezing. Touching your mouth or nose and then touching a surface, which is  then touched by another person, can also spread the virus. SYMPTOMS  Symptoms typically develop 1 to 3 days after you come in contact with a cold virus. Symptoms vary from person to person. They may include:  Runny nose.  Sneezing.  Nasal congestion.  Sinus irritation.  Sore throat.  Loss of voice (laryngitis).  Cough.  Fatigue.  Muscle aches.  Loss of appetite.  Headache.  Low-grade fever. DIAGNOSIS  You might diagnose your own cold based on familiar symptoms, since most people get a cold 2 to 3 times a year. Your caregiver can confirm this based on your exam. Most importantly, your caregiver can check that your symptoms are not due to another disease such as strep throat, sinusitis, pneumonia, asthma, or epiglottitis. Blood tests, throat tests, and X-rays are not necessary to diagnose a common cold, but they may sometimes be helpful in excluding other more serious diseases. Your caregiver will decide if any further tests are required. RISKS AND COMPLICATIONS  You may be at risk for a more severe case of the common cold if you smoke cigarettes, have chronic heart disease (such as heart failure) or lung disease (such as asthma), or if you have a weakened immune system. The very young and very old are also at risk for more serious infections. Bacterial sinusitis, middle ear infections, and bacterial pneumonia can complicate the common cold. The common cold can worsen asthma and chronic obstructive pulmonary disease (COPD). Sometimes, these complications can require emergency medical care and may be life-threatening. PREVENTION  The best way to protect against getting a cold is to practice good hygiene. Avoid oral or hand contact with people with cold symptoms. Wash your hands often if contact occurs. There is no clear evidence that vitamin C, vitamin E, echinacea, or exercise reduces the chance of developing a cold. However, it is always recommended to get plenty of rest and practice  good nutrition. TREATMENT  Treatment is directed at relieving symptoms. There is no cure. Antibiotics are not effective, because the infection is caused by a virus, not by bacteria. Treatment may include:  Increased fluid intake. Sports drinks offer valuable electrolytes, sugars, and fluids.  Breathing heated mist or steam (vaporizer or shower).  Eating chicken soup or other clear broths, and maintaining good nutrition.  Getting plenty of rest.  Using gargles or lozenges for comfort.  Controlling fevers with ibuprofen or acetaminophen as directed by your caregiver.  Increasing usage of your inhaler if you have asthma. Zinc gel and zinc lozenges, taken in the first 24 hours of the common cold, can shorten the duration and lessen the severity of symptoms. Pain medicines may help with fever, muscle aches, and throat pain. A variety of non-prescription medicines are available to treat congestion and runny nose. Your caregiver can make recommendations and may suggest nasal or lung inhalers for other symptoms.  HOME CARE INSTRUCTIONS   Only take over-the-counter or prescription medicines for pain, discomfort, or fever as directed by your caregiver.  Use a warm mist humidifier or inhale steam from a shower to increase air moisture. This may keep secretions moist and make it easier to breathe.  Drink enough water and fluids to keep your urine clear or pale yellow.  Rest as needed.  Return to work when your temperature has returned to normal or as your caregiver advises. You may need to stay home longer to avoid infecting others. You can also use a face mask and careful hand washing to prevent spread of the virus. SEEK MEDICAL CARE IF:   After the first few days, you feel you are getting worse rather than better.  You need your caregiver's advice about medicines to control symptoms.  You develop chills, worsening shortness of breath, or brown or red sputum. These may be signs of  pneumonia.  You develop yellow or brown nasal discharge or pain in the face, especially when you bend forward. These may be signs of sinusitis.  You develop a fever, swollen neck glands, pain with swallowing, or white areas in the back of your throat. These may be signs of strep throat. SEEK IMMEDIATE MEDICAL CARE IF:   You have a fever.  You develop severe or persistent headache, ear pain, sinus pain, or chest pain.  You develop wheezing, a prolonged cough, cough up blood, or have a change in your usual mucus (if you have chronic lung disease).  You develop sore muscles or a stiff neck. Document Released: 01/29/2001 Document Revised: 10/28/2011 Document Reviewed: 11/10/2013 [facility] Patient Information 2015 [place], [place]. This information is not intended to replace advice given to you by your health care provider. Make sure you discuss any questions you have with your health care provider.  Sinusitis Sinusitis is redness, soreness, and inflammation of the paranasal sinuses. Paranasal sinuses are air pockets within the bones of your face (beneath the eyes, the middle of the forehead, or above the eyes). In healthy paranasal sinuses, mucus is able to drain out, and air is able to circulate through them by way of your nose. However, when your paranasal sinuses are inflamed, mucus and air can become trapped. This can allow bacteria and other germs to grow and cause infection. Sinusitis can develop quickly and last only a short time (acute) or continue over a long period (chronic). Sinusitis that lasts for more than 12 weeks is considered chronic.  CAUSES  Causes of sinusitis include: Allergies. Structural abnormalities, such as displacement of the cartilage that separates your nostrils (deviated septum), which can decrease the air flow through your nose and sinuses and affect sinus drainage. Functional abnormalities, such as when the small hairs (cilia) that line your sinuses and help remove  mucus do not work properly or are not present. SIGNS AND SYMPTOMS  Symptoms of acute and chronic sinusitis are the same. The primary symptoms are pain and pressure around the affected sinuses. Other symptoms include: Upper toothache. Earache. Headache. Bad breath. Decreased sense of smell  and taste. A cough, which worsens when you are lying flat. Fatigue. Fever. Thick drainage from your nose, which often is green and may contain pus (purulent). Swelling and warmth over the affected sinuses. DIAGNOSIS  Your health care provider will perform a physical exam. During the exam, your health care provider may: Look in your nose for signs of abnormal growths in your nostrils (nasal polyps). Tap over the affected sinus to check for signs of infection. View the inside of your sinuses (endoscopy) using an imaging device that has a light attached (endoscope). If your health care provider suspects that you have chronic sinusitis, one or more of the following tests may be recommended: Allergy tests. Nasal culture. A sample of mucus is taken from your nose, sent to a lab, and screened for bacteria. Nasal cytology. A sample of mucus is taken from your nose and examined by your health care provider to determine if your sinusitis is related to an allergy. TREATMENT  Most cases of acute sinusitis are related to a viral infection and will resolve on their own within 10 days. Sometimes medicines are prescribed to help relieve symptoms (pain medicine, decongestants, nasal steroid sprays, or saline sprays).  However, for sinusitis related to a bacterial infection, your health care provider will prescribe antibiotic medicines. These are medicines that will help kill the bacteria causing the infection.  Rarely, sinusitis is caused by a fungal infection. In theses cases, your health care provider will prescribe antifungal medicine. For some cases of chronic sinusitis, surgery is needed. Generally, these are cases in  which sinusitis recurs more than 3 times per year, despite other treatments. HOME CARE INSTRUCTIONS  Drink plenty of water. Water helps thin the mucus so your sinuses can drain more easily. Use a humidifier. Inhale steam 3 to 4 times a day (for example, sit in the bathroom with the shower running). Apply a warm, moist washcloth to your face 3 to 4 times a day, or as directed by your health care provider. Use saline nasal sprays to help moisten and clean your sinuses. Take medicines only as directed by your health care provider. If you were prescribed either an antibiotic or antifungal medicine, finish it all even if you start to feel better. SEEK IMMEDIATE MEDICAL CARE IF: You have increasing pain or severe headaches. You have nausea, vomiting, or drowsiness. You have swelling around your face. You have vision problems. You have a stiff neck. You have difficulty breathing. MAKE SURE YOU:  Understand these instructions. Will watch your condition. Will get help right away if you are not doing well or get worse. Document Released: 08/05/2005 Document Revised: 12/20/2013 Document Reviewed: 08/20/2011 [facility] Patient Information 2015 [place], [place]. This information is not intended to replace advice given to you by your health care provider. Make sure you discuss any questions you have with your health care provider. Follow up with Dr. Conley Rolls    I personally performed the services described in this documentation, which was scribed in my presence. The recorded information has been reviewed and considered, and addended by me as needed.

## 2014-10-25 NOTE — Patient Instructions (Addendum)
Start Qvar for asthma symptoms. Follow up with Dr. Conley RollsLe in next month. Albuterol if needed for wheezing - return here if having to use albuterol persistently more than 2-3 times per day.  Saline nasal spray atleast 4 times per day, over the counter mucinex or mucinex DM (IF this causes more wheezing - stop mucinex), drink plenty of fluids.  If sinus symptoms not improving in next few days - can start augmentin as prescribed. Return to the clinic or go to the nearest emergency room if any of your symptoms worsen or new symptoms occur.  Upper Respiratory Infection, Adult An upper respiratory infection (URI) is also sometimes known as the common cold. The upper respiratory tract includes the nose, sinuses, throat, trachea, and bronchi. Bronchi are the airways leading to the lungs. Most people improve within 1 week, but symptoms can last up to 2 weeks. A residual cough may last even longer.  CAUSES Many different viruses can infect the tissues lining the upper respiratory tract. The tissues become irritated and inflamed and often become very moist. Mucus production is also common. A cold is contagious. You can easily spread the virus to others by oral contact. This includes kissing, sharing a glass, coughing, or sneezing. Touching your mouth or nose and then touching a surface, which is then touched by another person, can also spread the virus. SYMPTOMS  Symptoms typically develop 1 to 3 days after you come in contact with a cold virus. Symptoms vary from person to person. They may include:  Runny nose.  Sneezing.  Nasal congestion.  Sinus irritation.  Sore throat.  Loss of voice (laryngitis).  Cough.  Fatigue.  Muscle aches.  Loss of appetite.  Headache.  Low-grade fever. DIAGNOSIS  You might diagnose your own cold based on familiar symptoms, since most people get a cold 2 to 3 times a year. Your caregiver can confirm this based on your exam. Most importantly, your caregiver can check  that your symptoms are not due to another disease such as strep throat, sinusitis, pneumonia, asthma, or epiglottitis. Blood tests, throat tests, and X-rays are not necessary to diagnose a common cold, but they may sometimes be helpful in excluding other more serious diseases. Your caregiver will decide if any further tests are required. RISKS AND COMPLICATIONS  You may be at risk for a more severe case of the common cold if you smoke cigarettes, have chronic heart disease (such as heart failure) or lung disease (such as asthma), or if you have a weakened immune system. The very young and very old are also at risk for more serious infections. Bacterial sinusitis, middle ear infections, and bacterial pneumonia can complicate the common cold. The common cold can worsen asthma and chronic obstructive pulmonary disease (COPD). Sometimes, these complications can require emergency medical care and may be life-threatening. PREVENTION  The best way to protect against getting a cold is to practice good hygiene. Avoid oral or hand contact with people with cold symptoms. Wash your hands often if contact occurs. There is no clear evidence that vitamin C, vitamin E, echinacea, or exercise reduces the chance of developing a cold. However, it is always recommended to get plenty of rest and practice good nutrition. TREATMENT  Treatment is directed at relieving symptoms. There is no cure. Antibiotics are not effective, because the infection is caused by a virus, not by bacteria. Treatment may include:  Increased fluid intake. Sports drinks offer valuable electrolytes, sugars, and fluids.  Breathing heated mist or steam (vaporizer  or shower).  Eating chicken soup or other clear broths, and maintaining good nutrition.  Getting plenty of rest.  Using gargles or lozenges for comfort.  Controlling fevers with ibuprofen or acetaminophen as directed by your caregiver.  Increasing usage of your inhaler if you have  asthma. Zinc gel and zinc lozenges, taken in the first 24 hours of the common cold, can shorten the duration and lessen the severity of symptoms. Pain medicines may help with fever, muscle aches, and throat pain. A variety of non-prescription medicines are available to treat congestion and runny nose. Your caregiver can make recommendations and may suggest nasal or lung inhalers for other symptoms.  HOME CARE INSTRUCTIONS   Only take over-the-counter or prescription medicines for pain, discomfort, or fever as directed by your caregiver.  Use a warm mist humidifier or inhale steam from a shower to increase air moisture. This may keep secretions moist and make it easier to breathe.  Drink enough water and fluids to keep your urine clear or pale yellow.  Rest as needed.  Return to work when your temperature has returned to normal or as your caregiver advises. You may need to stay home longer to avoid infecting others. You can also use a face mask and careful hand washing to prevent spread of the virus. SEEK MEDICAL CARE IF:   After the first few days, you feel you are getting worse rather than better.  You need your caregiver's advice about medicines to control symptoms.  You develop chills, worsening shortness of breath, or brown or red sputum. These may be signs of pneumonia.  You develop yellow or brown nasal discharge or pain in the face, especially when you bend forward. These may be signs of sinusitis.  You develop a fever, swollen neck glands, pain with swallowing, or white areas in the back of your throat. These may be signs of strep throat. SEEK IMMEDIATE MEDICAL CARE IF:   You have a fever.  You develop severe or persistent headache, ear pain, sinus pain, or chest pain.  You develop wheezing, a prolonged cough, cough up blood, or have a change in your usual mucus (if you have chronic lung disease).  You develop sore muscles or a stiff neck. Document Released: 01/29/2001  Document Revised: 10/28/2011 Document Reviewed: 11/10/2013 [facility] Patient Information 2015 [place], [place]. This information is not intended to replace advice given to you by your health care provider. Make sure you discuss any questions you have with your health care provider.  Sinusitis Sinusitis is redness, soreness, and inflammation of the paranasal sinuses. Paranasal sinuses are air pockets within the bones of your face (beneath the eyes, the middle of the forehead, or above the eyes). In healthy paranasal sinuses, mucus is able to drain out, and air is able to circulate through them by way of your nose. However, when your paranasal sinuses are inflamed, mucus and air can become trapped. This can allow bacteria and other germs to grow and cause infection. Sinusitis can develop quickly and last only a short time (acute) or continue over a long period (chronic). Sinusitis that lasts for more than 12 weeks is considered chronic.  CAUSES  Causes of sinusitis include: Allergies. Structural abnormalities, such as displacement of the cartilage that separates your nostrils (deviated septum), which can decrease the air flow through your nose and sinuses and affect sinus drainage. Functional abnormalities, such as when the small hairs (cilia) that line your sinuses and help remove mucus do not work properly or are  not present. SIGNS AND SYMPTOMS  Symptoms of acute and chronic sinusitis are the same. The primary symptoms are pain and pressure around the affected sinuses. Other symptoms include: Upper toothache. Earache. Headache. Bad breath. Decreased sense of smell and taste. A cough, which worsens when you are lying flat. Fatigue. Fever. Thick drainage from your nose, which often is green and may contain pus (purulent). Swelling and warmth over the affected sinuses. DIAGNOSIS  Your health care provider will perform a physical exam. During the exam, your health care provider may: Look in your  nose for signs of abnormal growths in your nostrils (nasal polyps). Tap over the affected sinus to check for signs of infection. View the inside of your sinuses (endoscopy) using an imaging device that has a light attached (endoscope). If your health care provider suspects that you have chronic sinusitis, one or more of the following tests may be recommended: Allergy tests. Nasal culture. A sample of mucus is taken from your nose, sent to a lab, and screened for bacteria. Nasal cytology. A sample of mucus is taken from your nose and examined by your health care provider to determine if your sinusitis is related to an allergy. TREATMENT  Most cases of acute sinusitis are related to a viral infection and will resolve on their own within 10 days. Sometimes medicines are prescribed to help relieve symptoms (pain medicine, decongestants, nasal steroid sprays, or saline sprays).  However, for sinusitis related to a bacterial infection, your health care provider will prescribe antibiotic medicines. These are medicines that will help kill the bacteria causing the infection.  Rarely, sinusitis is caused by a fungal infection. In theses cases, your health care provider will prescribe antifungal medicine. For some cases of chronic sinusitis, surgery is needed. Generally, these are cases in which sinusitis recurs more than 3 times per year, despite other treatments. HOME CARE INSTRUCTIONS  Drink plenty of water. Water helps thin the mucus so your sinuses can drain more easily. Use a humidifier. Inhale steam 3 to 4 times a day (for example, sit in the bathroom with the shower running). Apply a warm, moist washcloth to your face 3 to 4 times a day, or as directed by your health care provider. Use saline nasal sprays to help moisten and clean your sinuses. Take medicines only as directed by your health care provider. If you were prescribed either an antibiotic or antifungal medicine, finish it all even if you  start to feel better. SEEK IMMEDIATE MEDICAL CARE IF: You have increasing pain or severe headaches. You have nausea, vomiting, or drowsiness. You have swelling around your face. You have vision problems. You have a stiff neck. You have difficulty breathing. MAKE SURE YOU:  Understand these instructions. Will watch your condition. Will get help right away if you are not doing well or get worse. Document Released: 08/05/2005 Document Revised: 12/20/2013 Document Reviewed: 08/20/2011 [facility] Patient Information 2015 [place], [place]. This information is not intended to replace advice given to you by your health care provider. Make sure you discuss any questions you have with your health care provider. Follow up with Dr. Conley Rolls

## 2015-04-28 IMAGING — CR DG CHEST 2V
2 series · 2 of 2 positions shown · non-contrast
Comparison: June 15, 2013

CLINICAL DATA: Two week history of nonproductive cough.
Intermittent wheezing and difficulty breathing

EXAM:
CHEST  2 VIEW

[PA]
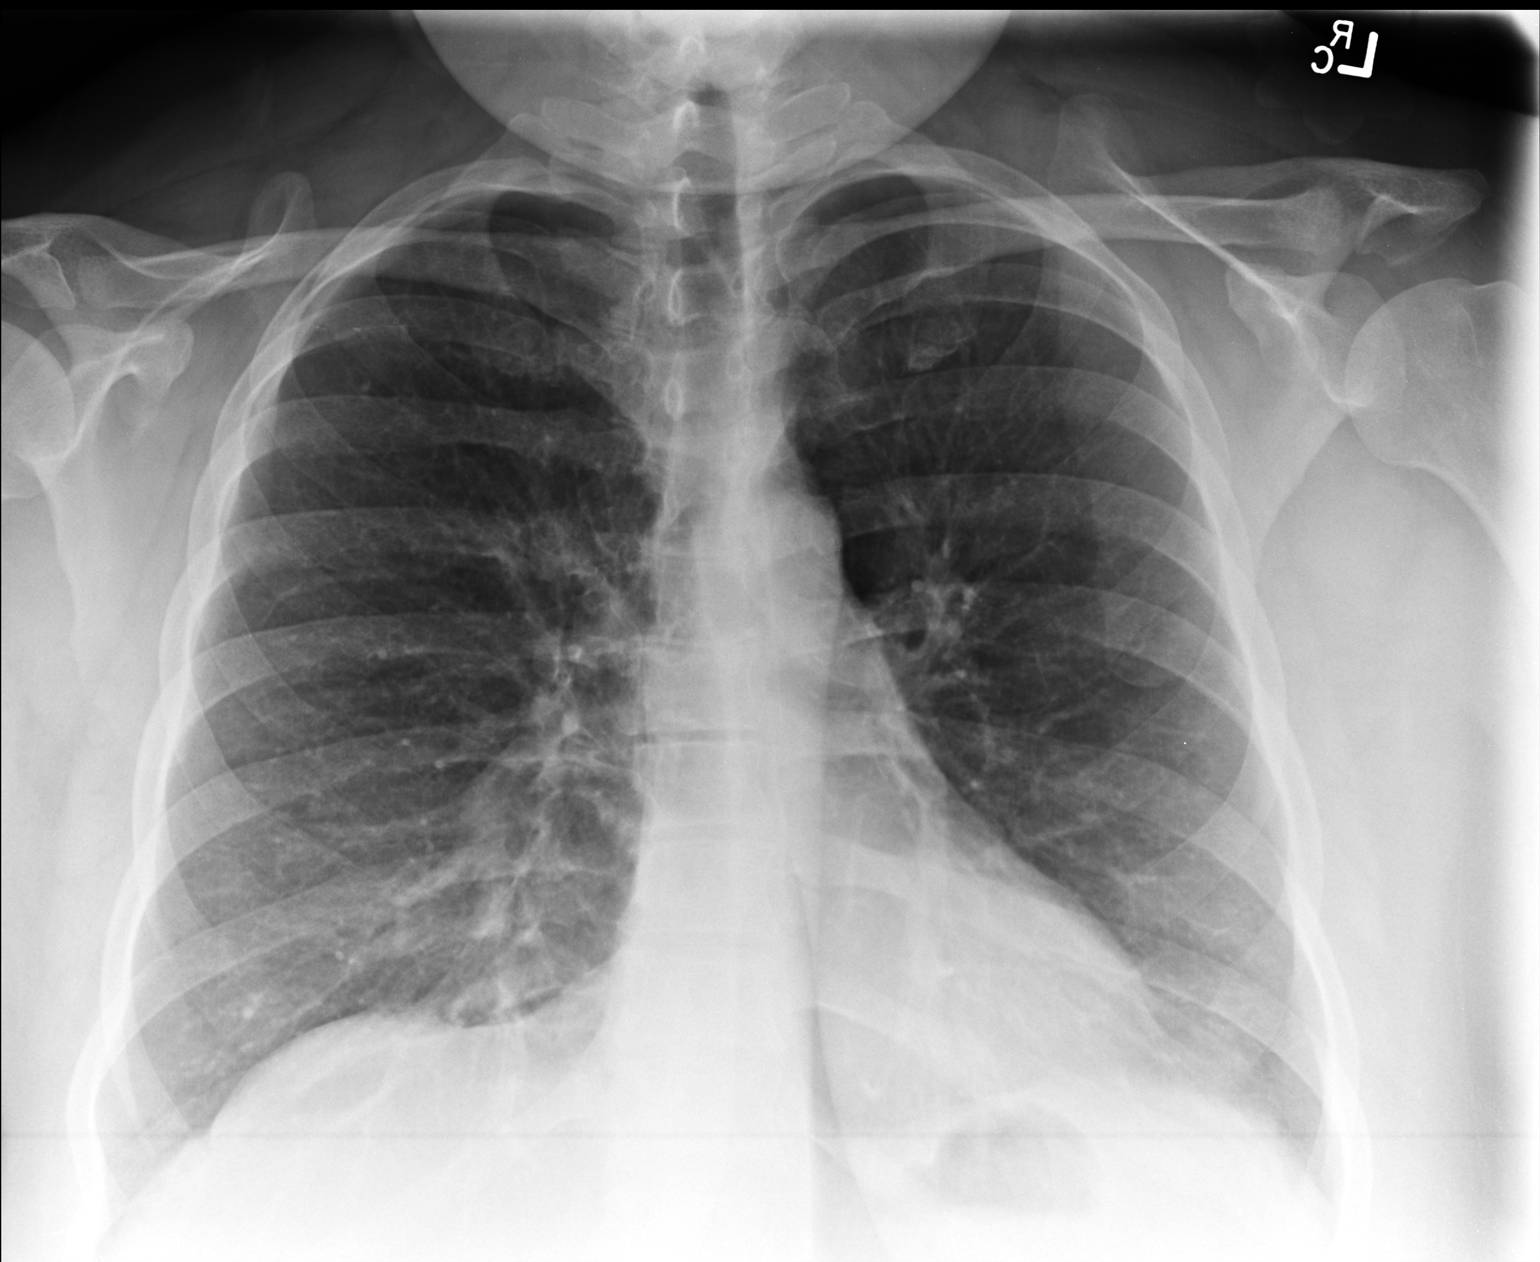

[lateral]
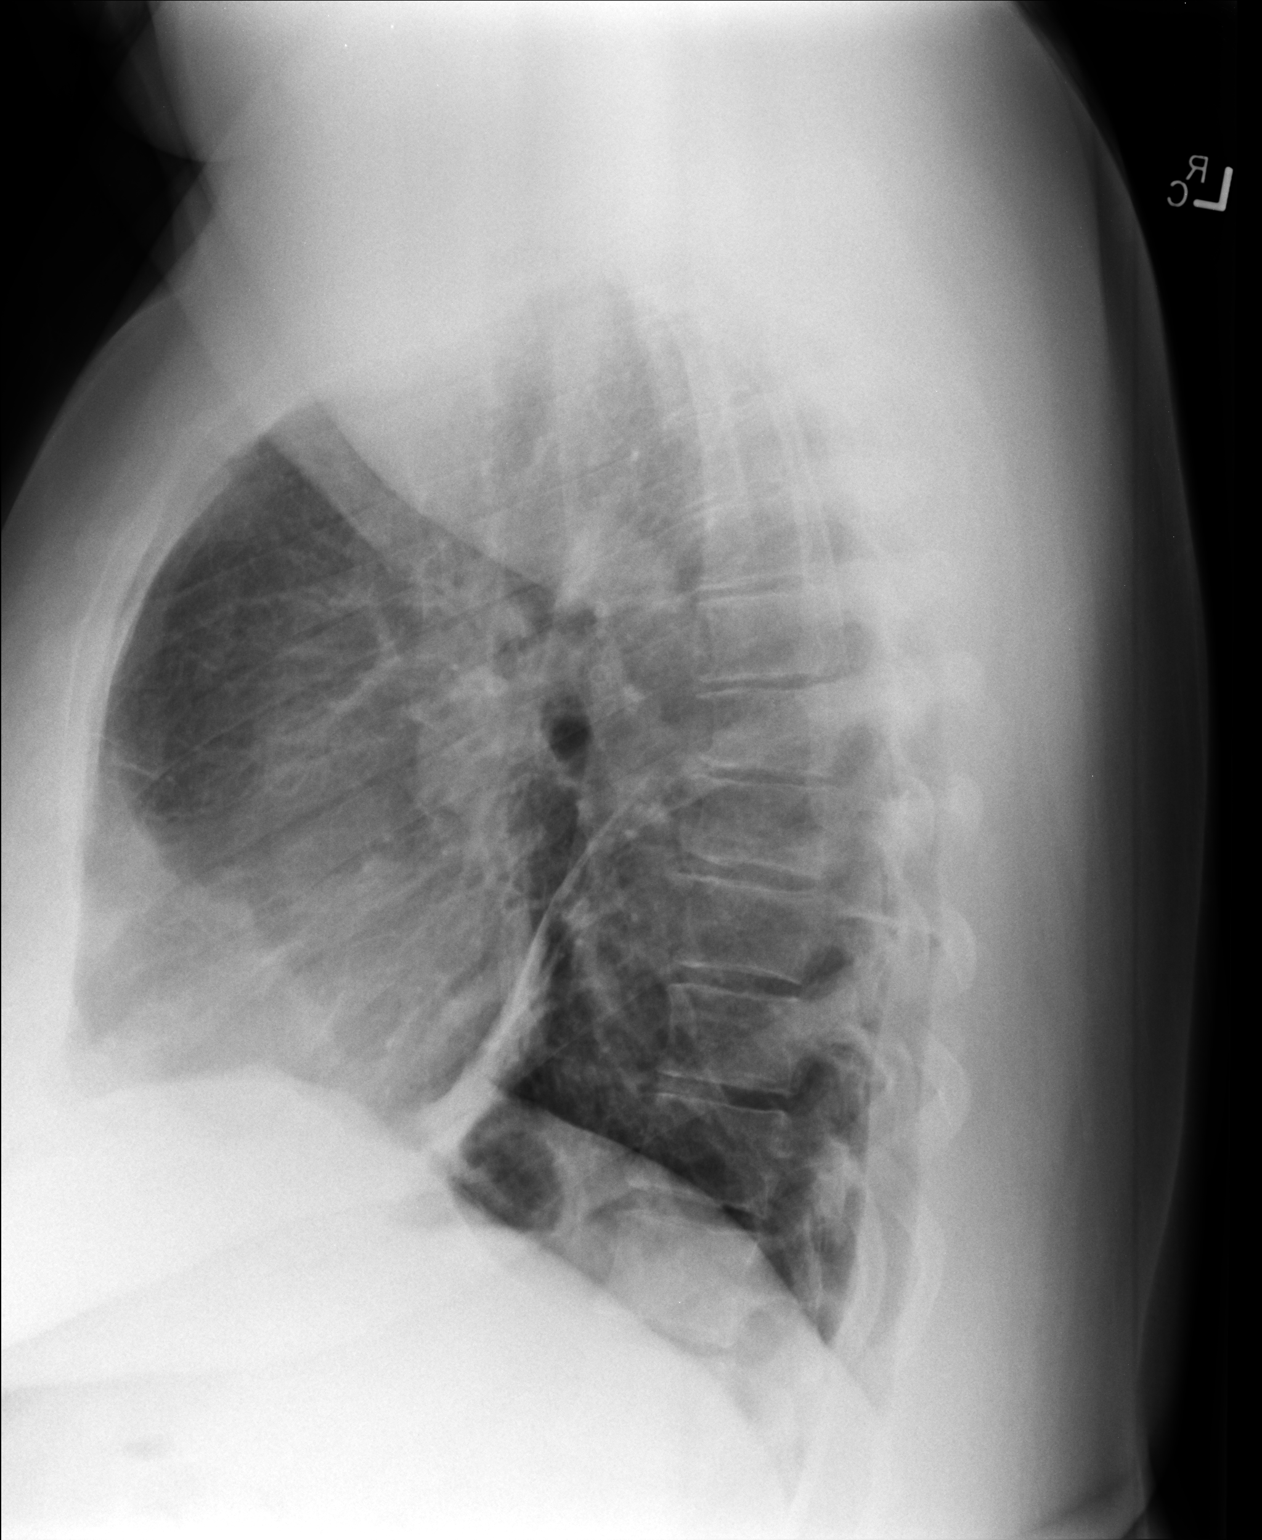

[2 of 2 positions shown; findings below may reference images not displayed]

FINDINGS: There is focal airspace consolidation in the medial left base.
Elsewhere lungs are clear. There is a degree of underlying
emphysematous change. The heart size is normal. Pulmonary
vascularity reflects underlying emphysema. No adenopathy. No bone
lesions.
IMPRESSION: Focal area of infiltrate medial left base. Underlying emphysematous
change.

## 2016-02-22 ENCOUNTER — Emergency Department (HOSPITAL_COMMUNITY): Payer: Managed Care, Other (non HMO)

## 2016-02-22 ENCOUNTER — Inpatient Hospital Stay (HOSPITAL_COMMUNITY)
Admission: EM | Admit: 2016-02-22 | Discharge: 2016-02-24 | DRG: 418 | Disposition: A | Discharge status: Home / Self Care | Payer: Managed Care, Other (non HMO) | Attending: General Surgery | Admitting: General Surgery

## 2016-02-22 ENCOUNTER — Encounter (HOSPITAL_COMMUNITY): Payer: Self-pay | Admitting: Nurse Practitioner

## 2016-02-22 DIAGNOSIS — K219 Gastro-esophageal reflux disease without esophagitis: Secondary | ICD-10-CM | POA: Diagnosis present

## 2016-02-22 DIAGNOSIS — K8010 Calculus of gallbladder with chronic cholecystitis without obstruction: Secondary | ICD-10-CM | POA: Diagnosis present

## 2016-02-22 DIAGNOSIS — K821 Hydrops of gallbladder: Secondary | ICD-10-CM | POA: Diagnosis present

## 2016-02-22 DIAGNOSIS — F17210 Nicotine dependence, cigarettes, uncomplicated: Secondary | ICD-10-CM | POA: Diagnosis present

## 2016-02-22 DIAGNOSIS — K660 Peritoneal adhesions (postprocedural) (postinfection): Secondary | ICD-10-CM | POA: Diagnosis present

## 2016-02-22 DIAGNOSIS — K810 Acute cholecystitis: Secondary | ICD-10-CM

## 2016-02-22 DIAGNOSIS — R1011 Right upper quadrant pain: Secondary | ICD-10-CM | POA: Diagnosis present

## 2016-02-22 DIAGNOSIS — K8000 Calculus of gallbladder with acute cholecystitis without obstruction: Secondary | ICD-10-CM | POA: Diagnosis present

## 2016-02-22 LAB — COMPREHENSIVE METABOLIC PANEL
ALBUMIN: 3.8 g/dL (ref 3.5–5.0)
ALK PHOS: 76 U/L (ref 38–126)
ALT: 21 U/L (ref 14–54)
ANION GAP: 11 (ref 5–15)
AST: 19 U/L (ref 15–41)
BILIRUBIN TOTAL: 0.6 mg/dL (ref 0.3–1.2)
BUN: 12 mg/dL (ref 6–20)
CALCIUM: 9.6 mg/dL (ref 8.9–10.3)
CO2: 23 mmol/L (ref 22–32)
Chloride: 103 mmol/L (ref 101–111)
Creatinine, Ser: 0.71 mg/dL (ref 0.44–1.00)
GFR calc Af Amer: >60 mL/min (ref >60)
GLUCOSE: 96 mg/dL (ref 65–99)
Potassium: 3.7 mmol/L (ref 3.5–5.1)
Sodium: 137 mmol/L (ref 135–145)
TOTAL PROTEIN: 7.5 g/dL (ref 6.5–8.1)

## 2016-02-22 LAB — URINALYSIS, ROUTINE W REFLEX MICROSCOPIC
BILIRUBIN URINE: NEGATIVE
Glucose, UA: NEGATIVE mg/dL
Hgb urine dipstick: NEGATIVE
KETONES UR: 15 mg/dL — AB
LEUKOCYTES UA: NEGATIVE
NITRITE: NEGATIVE
Protein, ur: NEGATIVE mg/dL
SPECIFIC GRAVITY, URINE: 1.029 (ref 1.005–1.030)
pH: 6 (ref 5.0–8.0)

## 2016-02-22 LAB — LIPASE, BLOOD: LIPASE: 19 U/L (ref 11–51)

## 2016-02-22 LAB — CBC
HCT: 41 % (ref 36.0–46.0)
HEMOGLOBIN: 13.1 g/dL (ref 12.0–15.0)
MCH: 27.9 pg (ref 26.0–34.0)
MCHC: 32 g/dL (ref 30.0–36.0)
MCV: 87.4 fL (ref 78.0–100.0)
Platelets: 366 10*3/uL (ref 150–400)
RBC: 4.69 MIL/uL (ref 3.87–5.11)
RDW: 15.1 % (ref 11.5–15.5)
WBC: 14.5 10*3/uL — AB (ref 4.0–10.5)

## 2016-02-22 MED ORDER — ONDANSETRON HCL 4 MG/2ML IJ SOLN
4.0000 mg | Freq: Once | INTRAMUSCULAR | Status: AC
  Administered 2016-02-22: 4 mg via INTRAVENOUS
  Filled 2016-02-22: qty 2

## 2016-02-22 MED ORDER — SODIUM CHLORIDE 0.9 % IV BOLUS (SEPSIS)
1000.0000 mL | Freq: Once | INTRAVENOUS | Status: AC
  Administered 2016-02-22: 1000 mL via INTRAVENOUS

## 2016-02-22 MED ORDER — MORPHINE SULFATE (PF) 4 MG/ML IV SOLN: 4.0000 mg | Freq: Once | INTRAVENOUS | Status: DC

## 2016-02-22 MED ORDER — MORPHINE SULFATE (PF) 4 MG/ML IV SOLN
4.0000 mg | Freq: Once | INTRAVENOUS | Status: AC
  Administered 2016-02-22: 4 mg via INTRAVENOUS
  Filled 2016-02-22: qty 1

## 2016-02-22 MED ORDER — FENTANYL CITRATE (PF) 100 MCG/2ML IJ SOLN
25.0000 ug | Freq: Once | INTRAMUSCULAR | Status: AC
  Administered 2016-02-22: 25 ug via INTRAVENOUS
  Filled 2016-02-22: qty 2

## 2016-02-22 NOTE — H&P (Signed)
Margaret Gomez is an 40 y.o. female.   Chief Complaint: Right upper quadrant abdominal pain and nausea HPI: Ms. Drozdowski has a known history of gallstones, initially diagnosed 3 or 4 years ago. She adjusted her diet after that diagnosis and has not had any problems until today when she developed severe right upper quadrant abdominal pain. This was associated with nausea. She came to the emergency department for further evaluation. Laboratory studies showed leukocytosis of 14,500. Liver function tests are normal. Ultrasound shows gallstones, gallbladder wall thickening, trace pericholecystic fluid, and positive ultrasonographic Murphy sign. I was asked to see her for management of cholecystitis. 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  Past Medical History  Diagnosis Date  . Allergy   . Asthma   . GERD (gastroesophageal reflux disease)     History reviewed. No pertinent past surgical history.  Family History  Problem Relation Age of Onset  . COPD Mother    Social History:  reports that she has been smoking Cigarettes.  She has been smoking about 0.50 packs per day. She does not have any smokeless tobacco history on file. She reports that she drinks alcohol. She reports that she does not use illicit drugs.  Allergies: No Known Allergies   (Not in a hospital admission)  Results for orders placed or performed during the hospital encounter of 02/22/16 (from the past 48 hour(s))  Comprehensive metabolic panel     Status: None   Collection Time: 02/22/16  6:26 PM  Result Value Ref Range   Sodium 137 135 - 145 mmol/L   Potassium 3.7 3.5 - 5.1 mmol/L   Chloride 103 101 - 111 mmol/L   CO2 23 22 - 32 mmol/L   Glucose, Bld 96 65 - 99 mg/dL   BUN 12 6 - 20 mg/dL   Creatinine, Ser 0.71 0.44 - 1.00 mg/dL   Calcium 9.6 8.9 - 10.3 mg/dL   Total Protein 7.5 6.5 - 8.1 g/dL   Albumin 3.8 3.5 - 5.0 g/dL   AST 19 15 - 41 U/L   ALT 21 14 - 54 U/L   Alkaline Phosphatase 76 38 - 126 U/L   Total Bilirubin 0.6 0.3 - 1.2 mg/dL   GFR calc non Af Amer >60 >60 mL/min   GFR calc Af Amer >60 >60 mL/min    Comment: (NOTE) The eGFR has been calculated using the CKD EPI equation. This calculation has not been validated in all clinical situations. eGFR's persistently <60 mL/min signify possible Chronic Kidney Disease.    Anion gap 11 5 - 15  CBC     Status: Abnormal   Collection Time: 02/22/16  6:26 PM  Result Value Ref Range   WBC 14.5 (H) 4.0 - 10.5 K/uL   RBC 4.69 3.87 - 5.11 MIL/uL   Hemoglobin 13.1 12.0 - 15.0 g/dL   HCT 41.0 36.0 - 46.0 %   MCV 87.4 78.0 - 100.0 fL   MCH 27.9 26.0 - 34.0 pg   MCHC 32.0 30.0 - 36.0 g/dL   RDW 15.1 11.5 - 15.5 %   Platelets 366 150 - 400 K/uL  Lipase, blood     Status: None   Collection Time: 02/22/16  6:26 PM  Result Value Ref Range   Lipase 19 11 - 51 U/L  Urinalysis, Routine w reflex microscopic     Status: Abnormal   Collection Time: 02/22/16 10:21 PM  Result Value Ref Range  Color, Urine AMBER (A) YELLOW    Comment: BIOCHEMICALS MAY BE AFFECTED BY COLOR   APPearance CLEAR CLEAR   Specific Gravity, Urine 1.029 1.005 - 1.030   pH 6.0 5.0 - 8.0   Glucose, UA NEGATIVE NEGATIVE mg/dL   Hgb urine dipstick NEGATIVE NEGATIVE   Bilirubin Urine NEGATIVE NEGATIVE   Ketones, ur 15 (A) NEGATIVE mg/dL   Protein, ur NEGATIVE NEGATIVE mg/dL   Nitrite NEGATIVE NEGATIVE   Leukocytes, UA NEGATIVE NEGATIVE    Comment: MICROSCOPIC NOT DONE ON URINES WITH NEGATIVE PROTEIN, BLOOD, LEUKOCYTES, NITRITE, OR GLUCOSE <1000 mg/dL.   US Abdomen Limited Ruq  02/22/2016  CLINICAL DATA:  Acute onset of right upper quadrant abdominal pain. Initial encounter. EXAM: US ABDOMEN LIMITED - RIGHT UPPER QUADRANT COMPARISON:  CT of the abdomen and pelvis performed 06/18/2013 FINDINGS: Gallbladder: Stones are seen dependently within the gallbladder. The gallbladder wall is mildly thickened, with trace pericholecystic fluid and a  positive ultrasonographic Murphy's sign, likely reflecting acute cholecystitis. Common bile duct: Diameter: 0.5 cm, within normal limits in caliber. Liver: No focal lesion identified. Within normal limits in parenchymal echogenicity. The liver appears mildly enlarged. IMPRESSION: 1. Mild gallbladder wall thickening, with trace pericholecystic fluid and positive ultrasonographic Murphy's sign, concerning for acute cholecystitis. Cholelithiasis noted. 2. Suggestion of mild hepatomegaly. Electronically Signed   By: Garald Balding M.D.   On: 02/22/2016 22:56    Review of Systems  Constitutional: Positive for malaise/fatigue.  HENT: Negative.   Eyes: Negative for blurred vision.  Respiratory: Negative for shortness of breath.   Cardiovascular: Negative for chest pain.  Gastrointestinal: Positive for nausea and abdominal pain. Negative for vomiting and diarrhea.  Genitourinary: Negative.   Musculoskeletal: Negative.   Skin: Negative.   Neurological: Negative.   Psychiatric/Behavioral: Negative.     Blood pressure 150/64, pulse 79, temperature 98.1 F (36.7 C), temperature source Oral, resp. rate 16, SpO2 93 %. Physical Exam  Constitutional: She is oriented to person, place, and time. She appears well-developed and well-nourished. No distress.  HENT:  Head: Normocephalic.  Right Ear: External ear normal.  Left Ear: External ear normal.  Eyes: Conjunctivae and EOM are normal. Pupils are equal, round, and reactive to light. No scleral icterus.  Neck: Neck supple.  Cardiovascular: Normal rate, normal heart sounds and intact distal pulses.   Respiratory: Effort normal and breath sounds normal. No respiratory distress. She has no wheezes. She has no rales.  GI: Soft. She exhibits no distension. There is tenderness. There is no rebound and no guarding.  Tender in the right upper quadrant with no guarding, no generalized tenderness, no peritonitis  Musculoskeletal: Normal range of motion. She  exhibits edema.  Significant bilateral lower extremity edema  Neurological: She is alert and oriented to person, place, and time. She exhibits normal muscle tone.  Skin: Skin is warm.  Psychiatric: She has a normal mood and affect.     Assessment/Plan Cholecystitis with cholelithiasis - admit, IV fluids, NPO, plan laparoscopic cholecystectomy with possible cholangiogram tomorrow. Procedure, risks, and benefits were discussed in detail with her. I will discuss her case with Dr. Ninfa Linden as well.  Zenovia Jarred, MD 02/22/2016, 11:18 PM

## 2016-02-22 NOTE — ED Notes (Signed)
Patient transported to Ultrasound 

## 2016-02-22 NOTE — ED Notes (Signed)
She c/o abd pain increasingly worse throughout the day today. She began to have n/v this afternoon. She denies bowel/bladder changes, fevers. She tried tums with no relief.

## 2016-02-22 NOTE — ED Provider Notes (Signed)
CSN: [id number]     Arrival date & time 02/22/16  1810 History   First MD Initiated Contact with Patient 02/22/16 2002     Chief Complaint  Patient presents with  . Abdominal Pain     (Consider location/radiation/quality/duration/timing/severity/associated sxs/prior Treatment) The history is provided by the patient.  Margaret Gomez is a 40 y.o. female history of reflux, gallstones here presenting with right upper quadrant pain. Patient states that she has history gallstones diagnosed on CT abdomen pelvis several years ago. She has not been follow up with surgery since it hasn't been bothering her. She started having right upper quadrant pain since yesterday. States that the pain is constant and she was unable to keep anything down. About 6 episodes of vomiting, initially with food and then just bile. Denies any fevers or urinary symptoms.     Past Medical History  Diagnosis Date  . Allergy   . Asthma   . GERD (gastroesophageal reflux disease)    History reviewed. No pertinent past surgical history. Family History  Problem Relation Age of Onset  . COPD Mother    Social History  Substance Use Topics  . Smoking status: Current Every Day Smoker -- 0.50 packs/day    Types: Cigarettes  . Smokeless tobacco: None  . Alcohol Use: Yes   OB History    No data available     Review of Systems  Gastrointestinal: Positive for abdominal pain.  All other systems reviewed and are negative.     Allergies  Review of patient's allergies indicates no known allergies.  Home Medications   Prior to Admission medications   Medication Sig Start Date End Date Taking? Authorizing Provider  albuterol (PROVENTIL HFA;VENTOLIN HFA) 108 (90 BASE) MCG/ACT inhaler Inhale 2 puffs into the lungs every 4 (four) hours as needed for wheezing or shortness of breath (cough, shortness of breath or wheezing.). 08/28/14  Yes Collene GobbleSteven A Daub, MD  beclomethasone (QVAR) 80 MCG/ACT inhaler Inhale 1 puff into the  lungs 2 (two) times daily. 10/25/14  Yes Shade FloodJeffrey R Greene, MD  Multiple Vitamins-Minerals [facility](MULTI COMPLETE PO) Take 1 tablet by mouth daily with breakfast.    Yes Historical Provider, MD  naproxen sodium (ALEVE) 220 MG tablet Take 220-440 mg by mouth 2 (two) times daily as needed (for pain).   Yes Historical Provider, MD  omeprazole (PRILOSEC OTC) 20 MG tablet Take 20 mg by mouth daily.   Yes Historical Provider, MD  senna-docusate (SENOKOT-S) 8.6-50 MG tablet Take 1-2 tablets by mouth at bedtime as needed for mild constipation.   Yes Historical Provider, MD  amoxicillin-clavulanate (AUGMENTIN) 875-125 MG per tablet Take 1 tablet by mouth 2 (two) times daily. Patient not taking: Reported on 02/22/2016 10/25/14   Shade FloodJeffrey R Greene, MD  omeprazole (PRILOSEC) 40 MG capsule Take 1 capsule (40 mg total) by mouth daily. Patient not taking: Reported on 02/22/2016 08/28/14   Collene GobbleSteven A Daub, MD   BP 150/64 mmHg  Pulse 79  Temp(Src) 98.1 F (36.7 C) (Oral)  Resp 16  SpO2 93% Physical Exam  Constitutional: She is oriented to person, place, and time.  Uncomfortable   HENT:  Head: Normocephalic.  Mouth/Throat: Oropharynx is clear and moist.  Eyes: Conjunctivae are normal. Pupils are equal, round, and reactive to light.  Neck: Normal range of motion. Neck supple.  Cardiovascular: Normal rate, regular rhythm and normal heart sounds.   Pulmonary/Chest: Effort normal and breath sounds normal. No respiratory distress. She has no wheezes. She has no rales.  Abdominal: Soft. Bowel sounds are normal. She exhibits no distension. There is no rebound.  RUQ tenderness, mild murphy's   Musculoskeletal: Normal range of motion.  Neurological: She is alert and oriented to person, place, and time.  Skin: Skin is warm and dry.  Psychiatric: She has a normal mood and affect. Her behavior is normal. Judgment and thought content normal.  Nursing note and vitals reviewed.   ED Course  Procedures (including critical care  time) Labs Review Labs Reviewed  CBC - Abnormal; Notable for the following:    WBC 14.5 (*)    All other components within normal limits  COMPREHENSIVE METABOLIC PANEL  LIPASE, BLOOD  URINALYSIS, ROUTINE W REFLEX MICROSCOPIC (NOT [facility])    Imaging Review [place] Abdomen Limited Ruq  02/22/2016  CLINICAL DATA:  Acute onset of right upper quadrant abdominal pain. Initial encounter. EXAM: US ABDOMEN LIMITED - RIGHT UPPER QUADRANT COMPARISON:  CT of the abdomen and pelvis performed 06/18/2013 FINDINGS: Gallbladder: Stones are seen dependently within the gallbladder. The gallbladder wall is mildly thickened, with trace pericholecystic fluid and a positive ultrasonographic Murphy's sign, likely reflecting acute cholecystitis. Common bile duct: Diameter: 0.5 cm, within normal limits in caliber. Liver: No focal lesion identified. Within normal limits in parenchymal echogenicity. The liver appears mildly enlarged. IMPRESSION: 1. Mild gallbladder wall thickening, with trace pericholecystic fluid and positive ultrasonographic Murphy's sign, concerning for acute cholecystitis. Cholelithiasis noted. 2. Suggestion of mild hepatomegaly. Electronically Signed   By: Roanna RaiderJeffery  Chang M.D.   On: 02/22/2016 22:56   I have personally reviewed and evaluated these images and lab results as part of my medical decision-making.   EKG Interpretation None      MDM   Final diagnoses:  RUQ pain    Margaret Gomez is a 40 y.o. female here with RUQ pain. Likely biliary colic. Will get labs, RUQ US.   11:03 PM WBC 14.5. RUQ US showed acute cholecystitis. LFTs nl. Still has pain despite pain meds. Still nauseated. Consulted Dr. Janee Mornhompson from surgery, who will admit for cholecystectomy.      Richardean Canalavid H Gazella Anglin, MD 02/22/16 [phone]

## 2016-02-22 NOTE — ED Notes (Signed)
Dr. Thompson at bedside. 

## 2016-02-22 NOTE — ED Notes (Signed)
Back from us

## 2016-02-23 ENCOUNTER — Encounter (HOSPITAL_COMMUNITY): Payer: Self-pay | Admitting: Anesthesiology

## 2016-02-23 ENCOUNTER — Inpatient Hospital Stay (HOSPITAL_COMMUNITY): Payer: Managed Care, Other (non HMO) | Admitting: Anesthesiology

## 2016-02-23 ENCOUNTER — Encounter (HOSPITAL_COMMUNITY): Admission: EM | Disposition: A | Discharge status: Home / Self Care | Payer: Self-pay | Source: Home / Self Care

## 2016-02-23 HISTORY — PX: CHOLECYSTECTOMY: SHX55

## 2016-02-23 LAB — SURGICAL PCR SCREEN
MRSA, PCR: NEGATIVE
STAPHYLOCOCCUS AUREUS: NEGATIVE

## 2016-02-23 SURGERY — LAPAROSCOPIC CHOLECYSTECTOMY WITH INTRAOPERATIVE CHOLANGIOGRAM
Anesthesia: General | Site: Abdomen

## 2016-02-23 MED ORDER — DEXAMETHASONE SODIUM PHOSPHATE 10 MG/ML IJ SOLN
INTRAMUSCULAR | Status: DC | PRN
  Administered 2016-02-23: 10 mg via INTRAVENOUS

## 2016-02-23 MED ORDER — KETOROLAC TROMETHAMINE 30 MG/ML IJ SOLN
INTRAMUSCULAR | Status: DC | PRN
  Administered 2016-02-23: 30 mg via INTRAVENOUS

## 2016-02-23 MED ORDER — PROPOFOL 10 MG/ML IV BOLUS
INTRAVENOUS | Status: AC
  Filled 2016-02-23: qty 20

## 2016-02-23 MED ORDER — PROMETHAZINE HCL 25 MG/ML IJ SOLN
12.5000 mg | INTRAMUSCULAR | Status: DC | PRN
  Administered 2016-02-23 (×2): 12.5 mg via INTRAVENOUS
  Filled 2016-02-23: qty 1

## 2016-02-23 MED ORDER — OXYCODONE-ACETAMINOPHEN 5-325 MG PO TABS
1.0000 | ORAL_TABLET | ORAL | Status: DC | PRN
  Administered 2016-02-23 – 2016-02-24 (×5): 2 via ORAL
  Filled 2016-02-23 (×5): qty 2

## 2016-02-23 MED ORDER — DEXAMETHASONE SODIUM PHOSPHATE 10 MG/ML IJ SOLN
INTRAMUSCULAR | Status: AC
  Filled 2016-02-23: qty 2

## 2016-02-23 MED ORDER — HYDRALAZINE HCL 20 MG/ML IJ SOLN: 10.0000 mg | INTRAMUSCULAR | Status: DC | PRN

## 2016-02-23 MED ORDER — OXYCODONE HCL 5 MG/5ML PO SOLN: 5.0000 mg | Freq: Once | ORAL | Status: DC | PRN

## 2016-02-23 MED ORDER — MIDAZOLAM HCL 2 MG/2ML IJ SOLN
INTRAMUSCULAR | Status: AC
  Filled 2016-02-23: qty 2

## 2016-02-23 MED ORDER — DIPHENHYDRAMINE HCL 25 MG PO CAPS: 25.0000 mg | ORAL_CAPSULE | Freq: Four times a day (QID) | ORAL | Status: DC | PRN

## 2016-02-23 MED ORDER — LACTATED RINGERS IV SOLN
INTRAVENOUS | Status: DC
  Administered 2016-02-23 (×2): via INTRAVENOUS

## 2016-02-23 MED ORDER — PHENYLEPHRINE 40 MCG/ML (10ML) SYRINGE FOR IV PUSH (FOR BLOOD PRESSURE SUPPORT)
PREFILLED_SYRINGE | INTRAVENOUS | Status: AC
  Filled 2016-02-23: qty 10

## 2016-02-23 MED ORDER — BUPIVACAINE-EPINEPHRINE 0.25% -1:200000 IJ SOLN
INTRAMUSCULAR | Status: DC | PRN
  Administered 2016-02-23: 20 mL

## 2016-02-23 MED ORDER — ACETAMINOPHEN 325 MG PO TABS: 325.0000 mg | ORAL_TABLET | ORAL | Status: DC | PRN

## 2016-02-23 MED ORDER — IOPAMIDOL (ISOVUE-300) INJECTION 61%
INTRAVENOUS | Status: AC
  Filled 2016-02-23: qty 50

## 2016-02-23 MED ORDER — PROMETHAZINE HCL 25 MG/ML IJ SOLN
INTRAMUSCULAR | Status: AC
  Filled 2016-02-23: qty 1

## 2016-02-23 MED ORDER — 0.9 % SODIUM CHLORIDE (POUR BTL) OPTIME
TOPICAL | Status: DC | PRN
  Administered 2016-02-23: 1000 mL

## 2016-02-23 MED ORDER — OXYCODONE HCL 5 MG PO TABS: 5.0000 mg | ORAL_TABLET | Freq: Once | ORAL | Status: DC | PRN

## 2016-02-23 MED ORDER — LIDOCAINE HCL (CARDIAC) 20 MG/ML IV SOLN
INTRAVENOUS | Status: DC | PRN
  Administered 2016-02-23: 100 mg via INTRAVENOUS

## 2016-02-23 MED ORDER — BUDESONIDE 0.5 MG/2ML IN SUSP
0.5000 mg | Freq: Two times a day (BID) | RESPIRATORY_TRACT | Status: DC
  Administered 2016-02-23 – 2016-02-24 (×3): 0.5 mg via RESPIRATORY_TRACT
  Filled 2016-02-23 (×3): qty 2

## 2016-02-23 MED ORDER — SODIUM CHLORIDE 0.9 % IR SOLN
Status: DC | PRN
  Administered 2016-02-23: 1000 mL

## 2016-02-23 MED ORDER — FENTANYL CITRATE (PF) 250 MCG/5ML IJ SOLN
INTRAMUSCULAR | Status: AC
  Filled 2016-02-23: qty 5

## 2016-02-23 MED ORDER — FENTANYL CITRATE (PF) 100 MCG/2ML IJ SOLN
INTRAMUSCULAR | Status: DC | PRN
  Administered 2016-02-23: 100 ug via INTRAVENOUS
  Administered 2016-02-23: 50 ug via INTRAVENOUS
  Administered 2016-02-23: 100 ug via INTRAVENOUS

## 2016-02-23 MED ORDER — DIPHENHYDRAMINE HCL 50 MG/ML IJ SOLN: 25.0000 mg | Freq: Four times a day (QID) | INTRAMUSCULAR | Status: DC | PRN

## 2016-02-23 MED ORDER — FENTANYL CITRATE (PF) 100 MCG/2ML IJ SOLN
25.0000 ug | INTRAMUSCULAR | Status: DC | PRN
  Administered 2016-02-23: 100 ug via INTRAVENOUS

## 2016-02-23 MED ORDER — MORPHINE SULFATE (PF) 2 MG/ML IV SOLN: 1.0000 mg | INTRAVENOUS | Status: DC | PRN

## 2016-02-23 MED ORDER — ROCURONIUM BROMIDE 100 MG/10ML IV SOLN
INTRAVENOUS | Status: DC | PRN
  Administered 2016-02-23: 50 mg via INTRAVENOUS
  Administered 2016-02-23: 20 mg via INTRAVENOUS

## 2016-02-23 MED ORDER — ALBUTEROL SULFATE (2.5 MG/3ML) 0.083% IN NEBU: 3.0000 mL | INHALATION_SOLUTION | RESPIRATORY_TRACT | Status: DC | PRN

## 2016-02-23 MED ORDER — ROCURONIUM BROMIDE 50 MG/5ML IV SOLN
INTRAVENOUS | Status: AC
  Filled 2016-02-23: qty 1

## 2016-02-23 MED ORDER — ONDANSETRON 4 MG PO TBDP: 4.0000 mg | ORAL_TABLET | Freq: Four times a day (QID) | ORAL | Status: DC | PRN

## 2016-02-23 MED ORDER — FENTANYL CITRATE (PF) 100 MCG/2ML IJ SOLN
INTRAMUSCULAR | Status: AC
  Filled 2016-02-23: qty 2

## 2016-02-23 MED ORDER — KETOROLAC TROMETHAMINE 30 MG/ML IJ SOLN
INTRAMUSCULAR | Status: AC
  Filled 2016-02-23: qty 1

## 2016-02-23 MED ORDER — BECLOMETHASONE DIPROPIONATE 80 MCG/ACT IN AERS: 1.0000 | INHALATION_SPRAY | Freq: Two times a day (BID) | RESPIRATORY_TRACT | Status: DC

## 2016-02-23 MED ORDER — NEOSTIGMINE METHYLSULFATE 5 MG/5ML IV SOSY
PREFILLED_SYRINGE | INTRAVENOUS | Status: AC
Start: 2016-02-23 — End: 2016-02-23
  Filled 2016-02-23: qty 5

## 2016-02-23 MED ORDER — ACETAMINOPHEN 160 MG/5ML PO SOLN
325.0000 mg | ORAL | Status: DC | PRN
  Filled 2016-02-23: qty 20.3

## 2016-02-23 MED ORDER — LIDOCAINE 2% (20 MG/ML) 5 ML SYRINGE
INTRAMUSCULAR | Status: AC
  Filled 2016-02-23: qty 10

## 2016-02-23 MED ORDER — DEXTROSE 5 % IV SOLN
2.0000 g | INTRAVENOUS | Status: DC
  Administered 2016-02-23 – 2016-02-24 (×2): 2 g via INTRAVENOUS
  Filled 2016-02-23 (×3): qty 2

## 2016-02-23 MED ORDER — PROPOFOL 10 MG/ML IV BOLUS
INTRAVENOUS | Status: DC | PRN
  Administered 2016-02-23: 200 mg via INTRAVENOUS

## 2016-02-23 MED ORDER — DEXTROSE 5 % IV SOLN
3.0000 g | Freq: Once | INTRAVENOUS | Status: AC
  Administered 2016-02-23: 3 g via INTRAVENOUS
  Filled 2016-02-23: qty 3000

## 2016-02-23 MED ORDER — GLYCOPYRROLATE 0.2 MG/ML IJ SOLN
INTRAMUSCULAR | Status: DC | PRN
  Administered 2016-02-23: 0.6 mg via INTRAVENOUS

## 2016-02-23 MED ORDER — ONDANSETRON HCL 4 MG/2ML IJ SOLN
INTRAMUSCULAR | Status: AC
  Filled 2016-02-23: qty 2

## 2016-02-23 MED ORDER — BUPIVACAINE-EPINEPHRINE (PF) 0.25% -1:200000 IJ SOLN
INTRAMUSCULAR | Status: AC
  Filled 2016-02-23: qty 30

## 2016-02-23 MED ORDER — MIDAZOLAM HCL 5 MG/5ML IJ SOLN
INTRAMUSCULAR | Status: DC | PRN
  Administered 2016-02-23: 2 mg via INTRAVENOUS

## 2016-02-23 MED ORDER — GLYCOPYRROLATE 0.2 MG/ML IV SOSY
PREFILLED_SYRINGE | INTRAVENOUS | Status: AC
  Filled 2016-02-23: qty 3

## 2016-02-23 MED ORDER — ONDANSETRON HCL 4 MG/2ML IJ SOLN
4.0000 mg | Freq: Four times a day (QID) | INTRAMUSCULAR | Status: DC | PRN
  Administered 2016-02-23: 4 mg via INTRAVENOUS

## 2016-02-23 MED ORDER — NEOSTIGMINE METHYLSULFATE 10 MG/10ML IV SOLN
INTRAVENOUS | Status: DC | PRN
  Administered 2016-02-23: 5 mg via INTRAVENOUS

## 2016-02-23 MED ORDER — KCL IN DEXTROSE-NACL 20-5-0.45 MEQ/L-%-% IV SOLN
INTRAVENOUS | Status: DC
  Administered 2016-02-23 – 2016-02-24 (×2): via INTRAVENOUS
  Filled 2016-02-23 (×3): qty 1000

## 2016-02-23 MED ORDER — FENTANYL CITRATE (PF) 100 MCG/2ML IJ SOLN
25.0000 ug | INTRAMUSCULAR | Status: DC | PRN
  Administered 2016-02-23 (×3): 50 ug via INTRAVENOUS
  Filled 2016-02-23 (×3): qty 2

## 2016-02-23 SURGICAL SUPPLY — 38 items
APPLIER CLIP 5 13 M/L LIGAMAX5 (MISCELLANEOUS) ×3
CANISTER SUCTION 2500CC (MISCELLANEOUS) ×3 IMPLANT
CHLORAPREP W/TINT 26ML (MISCELLANEOUS) ×3 IMPLANT
CLIP APPLIE 5 13 M/L LIGAMAX5 (MISCELLANEOUS) ×1 IMPLANT
COVER MAYO STAND STRL (DRAPES) ×3 IMPLANT
COVER SURGICAL LIGHT HANDLE (MISCELLANEOUS) ×3 IMPLANT
DECANTER SPIKE VIAL GLASS SM (MISCELLANEOUS) ×3 IMPLANT
DRAPE C-ARM 42X72 X-RAY (DRAPES) ×3 IMPLANT
ELECT REM PT RETURN 9FT ADLT (ELECTROSURGICAL) ×3
ELECTRODE REM PT RTRN 9FT ADLT (ELECTROSURGICAL) ×1 IMPLANT
GLOVE BIOGEL PI IND STRL 7.0 (GLOVE) ×1 IMPLANT
GLOVE BIOGEL PI IND STRL 7.5 (GLOVE) ×1 IMPLANT
GLOVE BIOGEL PI INDICATOR 7.0 (GLOVE) ×2
GLOVE BIOGEL PI INDICATOR 7.5 (GLOVE) ×2
GLOVE SURG SIGNA 7.5 PF LTX (GLOVE) ×3 IMPLANT
GLOVE SURG SS PI 6.5 STRL IVOR (GLOVE) ×3 IMPLANT
GOWN STRL REUS W/ TWL LRG LVL3 (GOWN DISPOSABLE) ×3 IMPLANT
GOWN STRL REUS W/ TWL XL LVL3 (GOWN DISPOSABLE) ×1 IMPLANT
GOWN STRL REUS W/TWL LRG LVL3 (GOWN DISPOSABLE) ×6
GOWN STRL REUS W/TWL XL LVL3 (GOWN DISPOSABLE) ×2
KIT BASIN OR (CUSTOM PROCEDURE TRAY) ×3 IMPLANT
KIT ROOM TURNOVER OR (KITS) ×3 IMPLANT
LIQUID BAND (GAUZE/BANDAGES/DRESSINGS) ×3 IMPLANT
NS IRRIG 1000ML POUR BTL (IV SOLUTION) ×3 IMPLANT
PAD ARMBOARD 7.5X6 YLW CONV (MISCELLANEOUS) ×3 IMPLANT
POUCH SPECIMEN RETRIEVAL 10MM (ENDOMECHANICALS) ×3 IMPLANT
SCISSORS LAP 5X35 DISP (ENDOMECHANICALS) ×3 IMPLANT
SET CHOLANGIOGRAPH 5 50 .035 (SET/KITS/TRAYS/PACK) ×3 IMPLANT
SET IRRIG TUBING LAPAROSCOPIC (IRRIGATION / IRRIGATOR) ×3 IMPLANT
SLEEVE ENDOPATH XCEL 5M (ENDOMECHANICALS) ×6 IMPLANT
SPECIMEN JAR SMALL (MISCELLANEOUS) ×3 IMPLANT
SUT MON AB 4-0 PC3 18 (SUTURE) ×3 IMPLANT
TOWEL OR 17X24 6PK STRL BLUE (TOWEL DISPOSABLE) ×3 IMPLANT
TOWEL OR 17X26 10 PK STRL BLUE (TOWEL DISPOSABLE) ×3 IMPLANT
TRAY LAPAROSCOPIC MC (CUSTOM PROCEDURE TRAY) ×3 IMPLANT
TROCAR XCEL BLUNT TIP 100MML (ENDOMECHANICALS) ×3 IMPLANT
TROCAR XCEL NON-BLD 5MMX100MML (ENDOMECHANICALS) ×3 IMPLANT
TUBING INSUFFLATION (TUBING) ×3 IMPLANT

## 2016-02-23 NOTE — Progress Notes (Signed)
Placed on hover matt per OR nurse request

## 2016-02-23 NOTE — Discharge Instructions (Signed)
Laparoscopic Cholecystectomy, Care After °Refer to this sheet in the next few weeks. These instructions provide you with information about caring for yourself after your procedure. Your health care provider may also give you more specific instructions. Your treatment has been planned according to current medical practices, but problems sometimes occur. Call your health care provider if you have any problems or questions after your procedure. °WHAT TO EXPECT AFTER THE PROCEDURE °After your procedure, it is common to have: °· Pain at your incision sites. You will be given pain medicines to control your pain. °· Mild nausea or vomiting. This should improve after the first 24 hours. °· Bloating and possible shoulder pain from the gas that was used during the procedure. This will improve after the first 24 hours. °HOME CARE INSTRUCTIONS °Incision Care °· Follow instructions from your health care provider about how to take care of your incisions. Make sure you: °¨ Wash your hands with soap and water before you change your bandage (dressing). If soap and water are not available, use hand sanitizer. °¨ Change your dressing as told by your health care provider. °¨ Leave stitches (sutures), skin glue, or adhesive strips in place. These skin closures may need to be in place for 2 weeks or longer. If adhesive strip edges start to loosen and curl up, you may trim the loose edges. Do not remove adhesive strips completely unless your health care provider tells you to do that. °· Do not take baths, swim, or use a hot tub until your health care provider approves. Ask your health care provider if you can take showers. You may only be allowed to take sponge baths for bathing. °General Instructions °· Take over-the-counter and prescription medicines only as told by your health care provider. °· Do not drive or operate heavy machinery while taking prescription pain medicine. °· Return to your normal diet as told by your health care  provider. °· Do not lift anything that is heavier than 10 lb (4.5 kg). °· Do not play contact sports for one week or until your health care provider approves. °SEEK MEDICAL CARE IF:  °· You have redness, swelling, or pain at the site of your incision. °· You have fluid, blood, or pus coming from your incision. °· You notice a bad smell coming from your incision area. °· Your surgical incisions break open. °· You have a fever. °SEEK IMMEDIATE MEDICAL CARE IF: °· You develop a rash. °· You have difficulty breathing. °· You have chest pain. °· You have increasing pain in your shoulders (shoulder strap areas). °· You faint or have dizzy episodes while you are standing. °· You have severe pain in your abdomen. °· You have nausea or vomiting that lasts for more than one day. °  °This information is not intended to replace advice given to you by your health care provider. Make sure you discuss any questions you have with your health care provider. °  °Document Released: 08/05/2005 Document Revised: 04/26/2015 Document Reviewed: 03/17/2013 °Elsevier Interactive Patient Education ©2016 [employer] °CCS ______[facility] °LAPAROSCOPIC SURGERY: POST OP INSTRUCTIONS °Always review your discharge instruction sheet given to you by the facility where your surgery was performed. °IF YOU HAVE DISABILITY OR FAMILY LEAVE FORMS, YOU MUST BRING THEM TO THE OFFICE FOR PROCESSING.   °DO NOT GIVE THEM TO YOUR DOCTOR. ° °1. A prescription for pain medication may be given to you upon discharge.  Take your pain medication as prescribed, if needed.  If narcotic   pain medicine is not needed, then you may take acetaminophen (Tylenol) or ibuprofen (Advil) as needed. °2. Take your usually prescribed medications unless otherwise directed. °3. If you need a refill on your pain medication, please contact your pharmacy.  They will contact our office to request authorization. Prescriptions will not be filled after 5pm or on  week-ends. °4. You should follow a light diet the first few days after arrival home, such as soup and crackers, etc.  Be sure to include lots of fluids daily. °5. Most patients will experience some swelling and bruising in the area of the incisions.  Ice packs will help.  Swelling and bruising can take several days to resolve.  °6. It is common to experience some constipation if taking pain medication after surgery.  Increasing fluid intake and taking a stool softener (such as Colace) will usually help or prevent this problem from occurring.  A mild laxative (Milk of Magnesia or Miralax) should be taken according to package instructions if there are no bowel movements after 48 hours. °7. Unless discharge instructions indicate otherwise, you may remove your bandages 24-48 hours after surgery, and you may shower at that time.  You may have steri-strips (small skin tapes) in place directly over the incision.  These strips should be left on the skin for 7-10 days.  If your surgeon used skin glue on the incision, you may shower in 24 hours.  The glue will flake off over the next 2-3 weeks.  Any sutures or staples will be removed at the office during your follow-up visit. °8. ACTIVITIES:  You may resume regular (light) daily activities beginning the next day--such as daily self-care, walking, climbing stairs--gradually increasing activities as tolerated.  You may have sexual intercourse when it is comfortable.  Refrain from any heavy lifting or straining until approved by your doctor. °a. You may drive when you are no longer taking prescription pain medication, you can comfortably wear a seatbelt, and you can safely maneuver your car and apply brakes. °b. RETURN TO WORK:  __________________________________________________________ °9. You should see your doctor in the office for a follow-up appointment approximately 2-3 weeks after your surgery.  Make sure that you call for this appointment within a day or two after you  arrive home to insure a convenient appointment time. °10. OTHER INSTRUCTIONS: __________________________________________________________________________________________________________________________ __________________________________________________________________________________________________________________________ °WHEN TO CALL YOUR DOCTOR: °1. Fever over 101.0 °2. Inability to urinate °3. Continued bleeding from incision. °4. Increased pain, redness, or drainage from the incision. °5. Increasing abdominal pain ° °The clinic staff is available to answer your questions during regular business hours.  Please don’t hesitate to call and ask to speak to one of the nurses for clinical concerns.  If you have a medical emergency, go to the nearest emergency room or call 911.  A surgeon from [facility] is always on call at the hospital. °[address], [address], [place], [place]  [address] ? [address] [address], [place], [place]   [address] °[phone] ? [phone] ? FAX [phone] °Web site: [email or web address] ° °

## 2016-02-23 NOTE — Transfer of Care (Signed)
Immediate Anesthesia Transfer of Care Note  Patient: Margaret Gomez  Procedure(s) Performed: Procedure(s): LAPAROSCOPIC CHOLECYSTECTOMY WITH INTRAOPERATIVE CHOLANGIOGRAM (N/A)  Patient Location: PACU  Anesthesia Type:General  Level of Consciousness: awake, alert , oriented and patient cooperative  Airway & Oxygen Therapy: Patient Spontanous Breathing and Patient connected to nasal cannula oxygen  Post-op Assessment: Report given to RN and Post -op Vital signs reviewed and stable  Post vital signs: Reviewed and stable  Last Vitals:  Filed Vitals:   02/23/16 1150 02/23/16 1210  BP:  135/68  Pulse: 69 73  Temp: 36.2 C 36.7 C  Resp: 18 18    Last Pain:  Filed Vitals:   02/23/16 1210  PainSc: Asleep      Patients Stated Pain Goal: 2 (02/23/16 0830)  Complications: No apparent anesthesia complications

## 2016-02-23 NOTE — Progress Notes (Signed)
Returned from PACU to 6n7 at this time, family at bedside

## 2016-02-23 NOTE — Progress Notes (Signed)
Patient ID: Margaret Gomez, female   DOB: 01/02/1976, 40 y.o.   MRN: [id number]  Pt with acute cholecystitis with gallstones Plan lap chole today I discussed the procedure in detail.    We discussed the risks and benefits of a laparoscopic cholecystectomy and possible cholangiogram including, but not limited to bleeding, infection, injury to surrounding structures such as the intestine or liver, bile leak, retained gallstones, need to convert to an open procedure, prolonged diarrhea, blood clots such as  DVT, common bile duct injury, anesthesia risks, and possible need for additional procedures.  The likelihood of improvement in symptoms and return to the patient's normal status is good. We discussed the typical post-operative recovery course.

## 2016-02-23 NOTE — Op Note (Signed)
Laparoscopic Cholecystectomy Procedure Note  Indications: This patient presents with symptomatic gallbladder disease and will undergo laparoscopic cholecystectomy.  Pre-operative Diagnosis: acute cholecystitis with cholelithiasis  Post-operative Diagnosis: Same  Surgeon: Abigail MiyamotoBLACKMAN,Eulene Pekar A   Assistants: 0  Anesthesia: General endotracheal anesthesia  ASA Class: 2  Procedure Details  The patient was seen again in the Holding Room. The risks, benefits, complications, treatment options, and expected outcomes were discussed with the patient. The possibilities of reaction to medication, pulmonary aspiration, perforation of viscus, bleeding, recurrent infection, finding a normal gallbladder, the need for additional procedures, failure to diagnose a condition, the possible need to convert to an open procedure, and creating a complication requiring transfusion or operation were discussed with the patient. The likelihood of improving the patient's symptoms with return to their baseline status is good.  The patient and/or family concurred with the proposed plan, giving informed consent. The site of surgery properly noted. The patient was taken to Operating Room, identified as Margaret Gomez and the procedure verified as Laparoscopic Cholecystectomy with Intraoperative Cholangiogram. A Time Out was held and the above information confirmed.  Prior to the induction of general anesthesia, antibiotic prophylaxis was administered. General endotracheal anesthesia was then administered and tolerated well. After the induction, the abdomen was prepped with Chloraprep and draped in sterile fashion. The patient was positioned in the supine position.  Local anesthetic agent was injected into the skin near the umbilicus and an incision made. We dissected down to the abdominal fascia with blunt dissection.  The fascia was incised vertically and we entered the peritoneal cavity bluntly.  A pursestring suture of  0-Vicryl was placed around the fascial opening.  The Hasson cannula was inserted and secured with the stay suture.  Pneumoperitoneum was then created with CO2 and tolerated well without any adverse changes in the patient's vital signs. A 5-mm port was placed in the subxiphoid position.  Two 5-mm ports were placed in the right upper quadrant. All skin incisions were infiltrated with a local anesthetic agent before making the incision and placing the trocars.   We positioned the patient in reverse Trendelenburg, tilted slightly to the patient's left.  The gallbladder was identified and was acutely distended.  I had to aspirate bile from the gallbladder in order to grasp it.  The fundus was then grasped and retracted cephalad. Adhesions were lysed bluntly and with the electrocautery where indicated, taking care not to injure any adjacent organs or viscus. The infundibulum was grasped and retracted laterally, exposing the peritoneum overlying the triangle of Calot. This was then divided and exposed in a blunt fashion. The cystic duct was clearly identified and bluntly dissected circumferentially. A critical view of the cystic duct and cystic artery was obtained.  The cystic duct was then ligated with clips and divided. The cystic artery was, dissected free, ligated with clips and divided as well.   The gallbladder was dissected from the liver bed in retrograde fashion with the electrocautery. The gallbladder was removed and placed in an Endocatch sac. The liver bed was irrigated and inspected. Hemostasis was achieved with the electrocautery. Copious irrigation was utilized and was repeatedly aspirated until clear.  The gallbladder and Endocatch sac were then removed through the umbilical port site.  The pursestring suture was used to close the umbilical fascia.    We again inspected the right upper quadrant for hemostasis.  Pneumoperitoneum was released as we removed the trocars.  4-0 Monocryl was used to close  the skin.   Skin glue  was then applied. The patient was then extubated and brought to the recovery room in stable condition. Instrument, sponge, and needle counts were correct at closure and at the conclusion of the case.   Findings: Cholecystitis with Cholelithiasis, Hydrops of the gallbladder  Estimated Blood Loss: Minimal         Drains: 0         Specimens: Gallbladder           Complications: None; patient tolerated the procedure well.         Disposition: PACU - hemodynamically stable.         Condition: stable

## 2016-02-23 NOTE — Anesthesia Preprocedure Evaluation (Signed)
Anesthesia Evaluation  Patient identified by MRN, date of birth, ID band Patient awake    Reviewed: Allergy & Precautions, NPO status , Patient's Chart, lab work & pertinent test results  History of Anesthesia Complications Negative for: history of anesthetic complications  Airway Mallampati: II  TM Distance: >3 FB Neck ROM: Full    Dental  (+) Teeth Intact   Pulmonary neg shortness of breath, asthma , neg sleep apnea, neg recent URI, Current Smoker,    breath sounds clear to auscultation       Cardiovascular negative cardio ROS   Rhythm:Regular     Neuro/Psych negative psych ROS   GI/Hepatic Neg liver ROS, GERD  ,  Endo/Other  Morbid obesity  Renal/GU negative Renal ROS     Musculoskeletal negative musculoskeletal ROS (+)   Abdominal   Peds  Hematology negative hematology ROS (+)   Anesthesia Other Findings   Reproductive/Obstetrics                             Anesthesia Physical Anesthesia Plan  ASA: III  Anesthesia Plan: General   Post-op Pain Management:    Induction: Intravenous  Airway Management Planned: Oral ETT  Additional Equipment: None  Intra-op Plan:   Post-operative Plan: Extubation in OR  Informed Consent: I have reviewed the patients History and Physical, chart, labs and discussed the procedure including the risks, benefits and alternatives for the proposed anesthesia with the patient or authorized representative who has indicated his/her understanding and acceptance.   Dental advisory given  Plan Discussed with: CRNA and Surgeon  Anesthesia Plan Comments:         Anesthesia Quick Evaluation

## 2016-02-24 MED ORDER — IBUPROFEN 800 MG PO TABS: 800.0000 mg | ORAL_TABLET | Freq: Three times a day (TID) | ORAL | Status: AC | PRN

## 2016-02-24 MED ORDER — OXYCODONE-ACETAMINOPHEN 5-325 MG PO TABS: 1.0000 | ORAL_TABLET | ORAL | Status: AC | PRN

## 2016-02-24 MED ORDER — KETOROLAC TROMETHAMINE 30 MG/ML IJ SOLN
30.0000 mg | Freq: Three times a day (TID) | INTRAMUSCULAR | Status: DC
  Administered 2016-02-24: 30 mg via INTRAVENOUS
  Filled 2016-02-24: qty 1

## 2016-02-24 NOTE — Progress Notes (Signed)
Discharge home. Home discharge instruction given, no questions verbalized. 

## 2016-02-24 NOTE — Progress Notes (Signed)
1 Day Post-Op  Subjective: No nausea. Very sore. Tolerated liquids. Pain meds take awhile to work. Hasn't walked in halls  Objective: Vital signs in last 24 hours: Temp:  [97.2 F (36.2 C)-98.7 F (37.1 C)] 98.3 F (36.8 C) (07/08 0522) Pulse Rate:  [68-96] 84 (07/08 0522) Resp:  [15-18] 17 (07/08 0522) BP: (112-160)/(59-86) 118/64 mmHg (07/08 0522) SpO2:  [89 %-100 %] 92 % (07/08 0522) Last BM Date: 02/22/16  Intake/Output from previous day: 07/07 0701 - 07/08 0700 In: 1270 [P.O.:120; I.V.:1100; IV Piggyback:50] Out: 1450 [Urine:1400; Blood:50] Intake/Output this shift:    Alert, nontoxic, sitting in bedside chair cta  Reg Obese, soft, mild approp TTP, incisions c/d/i  Lab Results:   Recent Labs  02/22/16 1826  WBC 14.5*  HGB 13.1  HCT 41.0  PLT 366   BMET  Recent Labs  02/22/16 1826  NA 137  K 3.7  CL 103  CO2 23  GLUCOSE 96  BUN 12  CREATININE 0.71  CALCIUM 9.6   PT/INR No results for input(s): LABPROT, INR in the last 72 hours. ABG No results for input(s): PHART, HCO3 in the last 72 hours.  Invalid input(s): PCO2, PO2  Studies/Results: [place] Abdomen Limited Ruq  02/22/2016  CLINICAL DATA:  Acute onset of right upper quadrant abdominal pain. Initial encounter. EXAM: US ABDOMEN LIMITED - RIGHT UPPER QUADRANT COMPARISON:  CT of the abdomen and pelvis performed 06/18/2013 FINDINGS: Gallbladder: Stones are seen dependently within the gallbladder. The gallbladder wall is mildly thickened, with trace pericholecystic fluid and a positive ultrasonographic Murphy's sign, likely reflecting acute cholecystitis. Common bile duct: Diameter: 0.5 cm, within normal limits in caliber. Liver: No focal lesion identified. Within normal limits in parenchymal echogenicity. The liver appears mildly enlarged. IMPRESSION: 1. Mild gallbladder wall thickening, with trace pericholecystic fluid and positive ultrasonographic Murphy's sign, concerning for acute cholecystitis.  Cholelithiasis noted. 2. Suggestion of mild hepatomegaly. Electronically Signed   By: Roanna RaiderJeffery  Chang M.D.   On: 02/22/2016 22:56    Anti-infectives: Anti-infectives    Start     Dose/Rate Route Frequency Ordered Stop   02/23/16 1030  ceFAZolin (ANCEF) 3 g in dextrose 5 % 50 mL IVPB     3 g 160 mL/hr over 30 Minutes Intravenous  Once 02/23/16 1024 02/23/16 1026   02/23/16 0023  cefTRIAXone (ROCEPHIN) 2 g in dextrose 5 % 50 mL IVPB     2 g 100 mL/hr over 30 Minutes Intravenous Every 24 hours 02/23/16 0023        Assessment/Plan: s/p Procedure(s): LAPAROSCOPIC CHOLECYSTECTOMY WITH INTRAOPERATIVE CHOLANGIOGRAM (N/A)  Will see how does with breakfast Add toradol  Discussed dc instructions.  If does well this am, dc this afternoon  Mary SellaEric M. Andrey CampanileWilson, MD, FACS General, Bariatric, & Minimally Invasive Surgery [facility] [place]   LOS: 2 days    Atilano InaWILSON,Deleon Passe M 02/24/2016

## 2016-02-24 NOTE — Anesthesia Postprocedure Evaluation (Signed)
Anesthesia Post Note  Patient: Margaret Gomez  Procedure(s) Performed: Procedure(s) (LRB): LAPAROSCOPIC CHOLECYSTECTOMY WITH INTRAOPERATIVE CHOLANGIOGRAM (N/A)  Patient location during evaluation: PACU Anesthesia Type: General Level of consciousness: awake Pain management: pain level controlled Vital Signs Assessment: post-procedure vital signs reviewed and stable Respiratory status: spontaneous breathing Cardiovascular status: stable Postop Assessment: no signs of nausea or vomiting Anesthetic complications: no     Last Vitals:  Filed Vitals:   02/24/16 0934 02/24/16 1344  BP: 130/64 128/75  Pulse: 82 94  Temp: 36.9 C 37.5 C  Resp: 18 18    Last Pain:  Filed Vitals:   02/24/16 1353  PainSc: 7    Pain Goal: Patients Stated Pain Goal: 2 (02/23/16 0830)               Christy Ehrsam

## 2016-02-26 ENCOUNTER — Encounter (HOSPITAL_COMMUNITY): Payer: Self-pay | Admitting: Surgery

## 2016-02-26 NOTE — Discharge Summary (Signed)
Physician Discharge Summary  Margaret Gomez ZOX:[id number] DOB: 03/04/1976 DOA: 02/22/2016  PCP: Rockne Coonshao Phuong Le, DO  Admit date: 02/22/2016 Discharge date: 02/24/2016  Recommendations for Outpatient Follow-up:   Follow-up Information    Follow up with [facility] On 03/13/2016.   Specialty:  General Surgery   Why:  Your appointment is at 10:15 AM, be at the office 30 minutes early for check in.     Contact information:   [address] [place] [address] [phone]       [facility]   Specialty:  Family Medicine   Why:  Call and follow up for medical issues, let them know you had surgery.   Contact information:   [address] [place] [id number] [phone]      Discharge Diagnoses:  1. Acute calculous cholecystitis 2. Asthma 3. gerd  Surgical Procedure: laparoscopic cholecystectomy Dr Magnus IvanBlackman  Discharge Condition: good  Disposition: home  Diet recommendation: regular  There were no vitals filed for this visit.  History of present illness:  Ms. Margaret Gomez has a known history of gallstones, initially diagnosed 3 or 4 years ago. She adjusted her diet after that diagnosis and has not had any problems until today when she developed severe right upper quadrant abdominal pain. This was associated with nausea. She came to the emergency department for further evaluation. Laboratory studies showed leukocytosis of 14,500. Liver function tests are normal. Ultrasound shows gallstones, gallbladder wall thickening, trace pericholecystic fluid, and positive ultrasonographic Murphy sign. I was asked to see her for management of cholecystitis. 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  Hospital Course:  She was admitted and placed on IV abx. She was taken to the operating room where she underwent lap cholecystectomy by Dr Magnus IvanBlackman. Please see op note for further details. On POD 1 by the  afternoon, she was tolerating a diet, her vitals were stable, she was ambulating in the halls and her pain was controlled. We had discussed dc instructions that morning.    Discharge Instructions  Discharge Instructions    Call MD for:  hives    Complete by:  As directed      Call MD for:  persistant dizziness or light-headedness    Complete by:  As directed      Call MD for:  persistant nausea and vomiting    Complete by:  As directed      Call MD for:  redness, tenderness, or signs of infection (pain, swelling, redness, odor or green/yellow discharge around incision site)    Complete by:  As directed      Call MD for:  severe uncontrolled pain    Complete by:  As directed      Call MD for:    Complete by:  As directed   Temperature >101     Diet general    Complete by:  As directed      Discharge instructions    Complete by:  As directed   See [facility] discharge instructions     Increase activity slowly    Complete by:  As directed             Medication List    STOP taking these medications        amoxicillin-clavulanate 875-125 MG tablet  Commonly known as:  AUGMENTIN     omeprazole 40 MG capsule  Commonly known as:  PRILOSEC      TAKE these medications        albuterol 108 (90 Base) MCG/ACT inhaler  Commonly known as:  PROVENTIL HFA;VENTOLIN HFA  Inhale 2 puffs into the lungs every 4 (four) hours as needed for wheezing or shortness of breath (cough, shortness of breath or wheezing.).     ALEVE 220 MG tablet  Generic drug:  naproxen sodium  Take 220-440 mg by mouth 2 (two) times daily as needed (for pain).     beclomethasone 80 MCG/ACT inhaler  Commonly known as:  QVAR  Inhale 1 puff into the lungs 2 (two) times daily.     ibuprofen 800 MG tablet  Commonly known as:  ADVIL,MOTRIN  Take 1 tablet (800 mg total) by mouth every 8 (eight) hours as needed.     MULTI COMPLETE PO  Take 1 tablet by mouth daily with breakfast.     omeprazole 20 MG tablet  Commonly  known as:  PRILOSEC OTC  Take 20 mg by mouth daily.     oxyCODONE-acetaminophen 5-325 MG tablet  Commonly known as:  PERCOCET/ROXICET  Take 1-2 tablets by mouth every 4 (four) hours as needed for moderate pain.     senna-docusate 8.6-50 MG tablet  Commonly known as:  Senokot-S  Take 1-2 tablets by mouth at bedtime as needed for mild constipation.           Follow-up Information    Follow up with [facility] On 03/13/2016.   Specialty:  General Surgery   Why:  Your appointment is at 10:15 AM, be at the office 30 minutes early for check in.     Contact information:   [address] [place] [address] [phone]       Follow up with [facility] Le, DO.   Specialty:  Family Medicine   Why:  Call and follow up for medical issues, let them know you had surgery.   Contact information:   [address] [place] [address] [phone]        The results of significant diagnostics from this hospitalization (including imaging, microbiology, ancillary and laboratory) are listed below for reference.    Significant Diagnostic Studies: US Abdomen Limited Ruq  March 13, 2016  CLINICAL DATA:  Acute onset of right upper quadrant abdominal pain. Initial encounter. EXAM: US ABDOMEN LIMITED - RIGHT UPPER QUADRANT COMPARISON:  CT of the abdomen and pelvis performed 06/18/2013 FINDINGS: Gallbladder: Stones are seen dependently within the gallbladder. The gallbladder wall is mildly thickened, with trace pericholecystic fluid and a positive ultrasonographic Murphy's sign, likely reflecting acute cholecystitis. Common bile duct: Diameter: 0.5 cm, within normal limits in caliber. Liver: No focal lesion identified. Within normal limits in parenchymal echogenicity. The liver appears mildly enlarged. IMPRESSION: 1. Mild gallbladder wall thickening, with trace pericholecystic fluid and positive ultrasonographic Murphy's sign, concerning for acute cholecystitis. Cholelithiasis  noted. 2. Suggestion of mild hepatomegaly. Electronically Signed   By: Roanna Raider M.D.   On: 03/13/16 22:56    Microbiology: Recent Results (from the past 240 hour(s))  Surgical pcr screen     Status: None   Collection Time: 02/23/16 12:42 AM  Result Value Ref Range Status   MRSA, PCR NEGATIVE NEGATIVE Final   Staphylococcus aureus NEGATIVE NEGATIVE Final    Comment:        The Xpert SA Assay (FDA approved for NASAL specimens in patients over 7 years of age), is one component of a comprehensive surveillance program.  Test performance has been validated by [facility] for patients greater than or equal to 45 year old. It is not intended to diagnose infection nor to guide or monitor treatment.      Labs: Basic Metabolic Panel:  Recent Labs Lab 02/22/16 1826  NA 137  K 3.7  CL 103  CO2 23  GLUCOSE 96  BUN 12  CREATININE 0.71  CALCIUM 9.6   Liver Function Tests:  Recent Labs Lab 02/22/16 1826  AST 19  ALT 21  ALKPHOS 76  BILITOT 0.6  PROT 7.5  ALBUMIN 3.8    Recent Labs Lab 02/22/16 1826  LIPASE 19   No results for input(s): AMMONIA in the last 168 hours. CBC:  Recent Labs Lab 02/22/16 1826  WBC 14.5*  HGB 13.1  HCT 41.0  MCV 87.4  PLT 366   Cardiac Enzymes: No results for input(s): CKTOTAL, CKMB, CKMBINDEX, TROPONINI in the last 168 hours. BNP: BNP (last 3 results) No results for input(s): BNP in the last 8760 hours.  ProBNP (last 3 results) No results for input(s): PROBNP in the last 8760 hours.  CBG: No results for input(s): GLUCAP in the last 168 hours.  Active Problems:   Cholecystitis with cholelithiasis   Time coordinating discharge: 10 minutes  Signed:  Atilano Ina, MD [facility], [place] [phone] 02/26/2016, 4:24 PM

## 2018-04-06 IMAGING — US US ABDOMEN LIMITED
1 series · 14 of 25 positions shown · non-contrast
Comparison: CT of the abdomen and pelvis performed 06/18/2013

CLINICAL DATA: Acute onset of right upper quadrant abdominal pain.
Initial encounter.

EXAM:
US ABDOMEN LIMITED - RIGHT UPPER QUADRANT

[Series 1: us abdomen limited · 0.28mm/px · 14 of 33 slices shown]
[im 1/33]
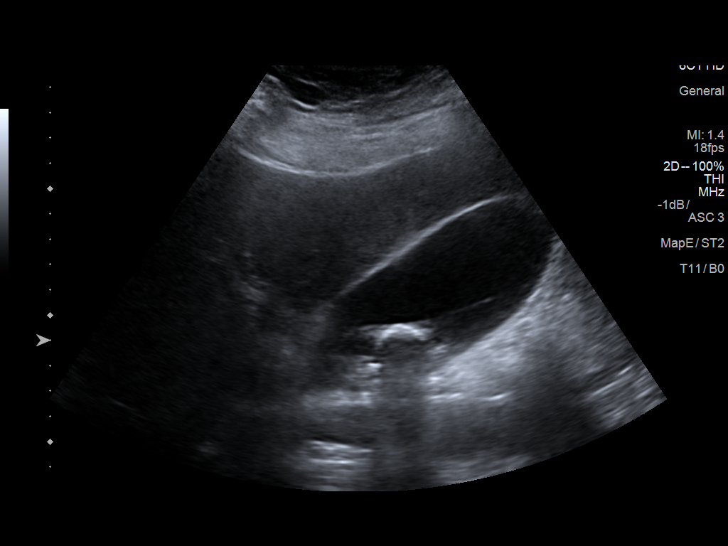
[im 3/33]
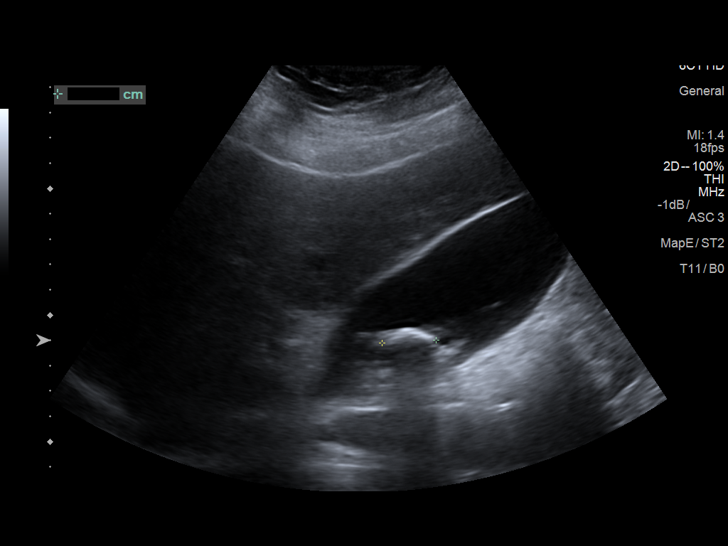
[im 6/33]
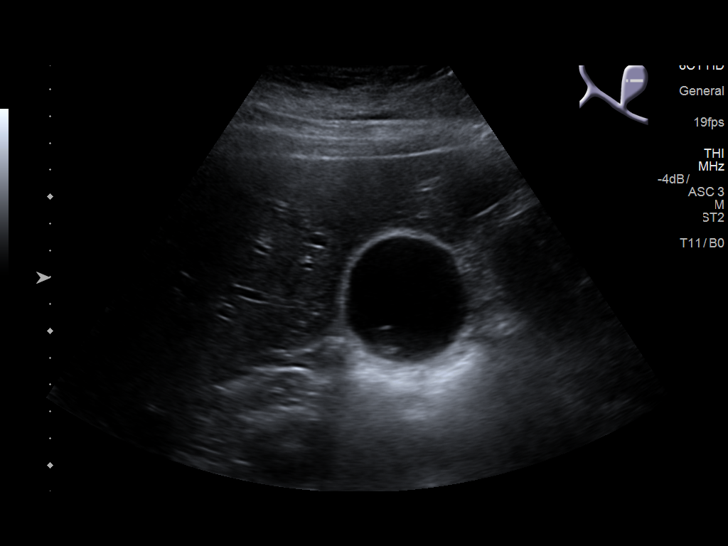
[im 9/33]
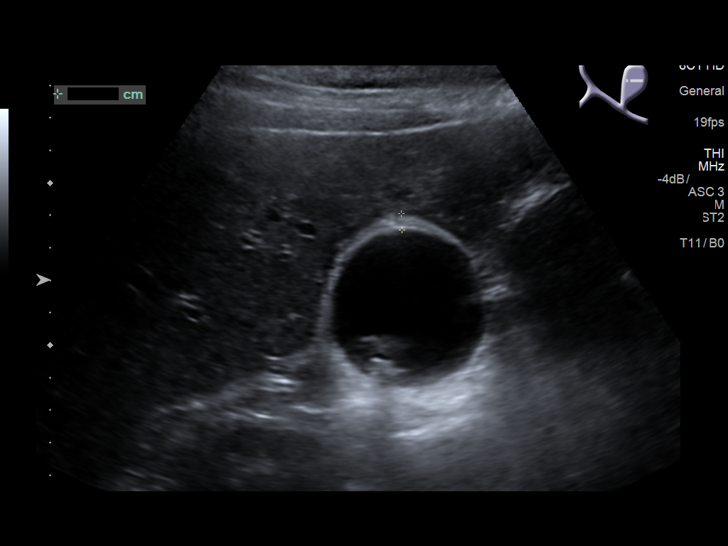
[im 11/33]
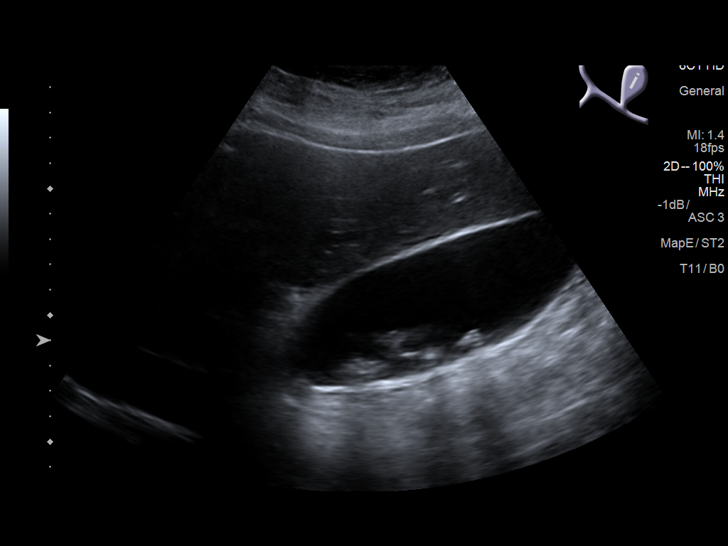
[im 13/33]
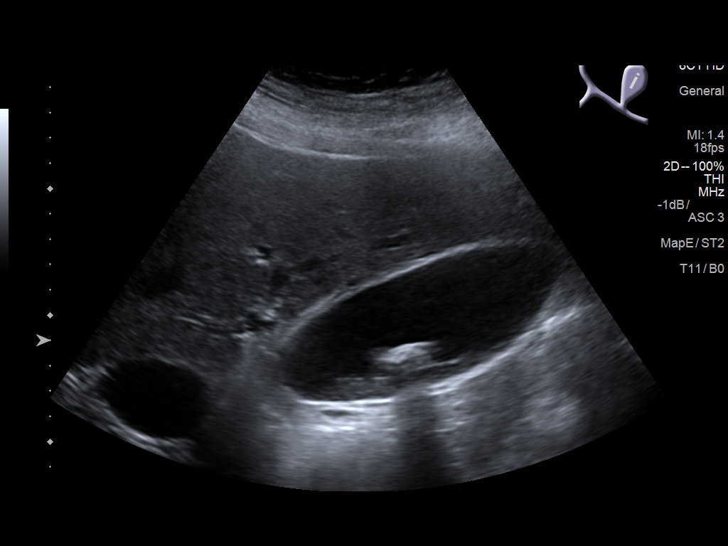
[im 15/33]
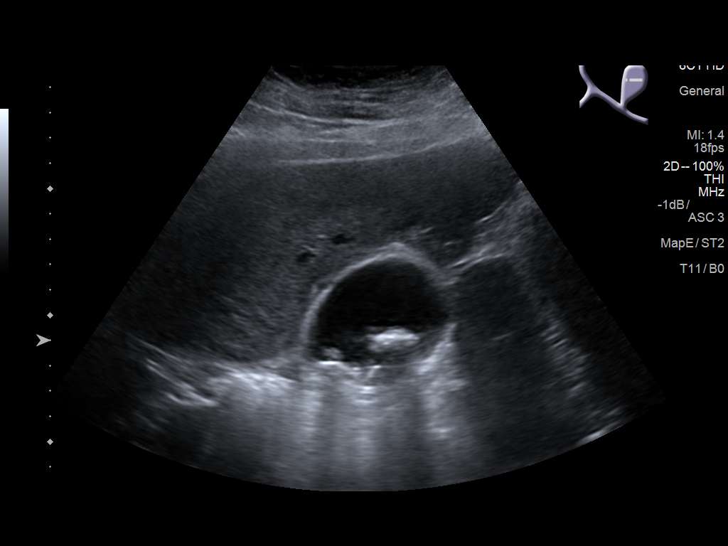
[im 18/33]
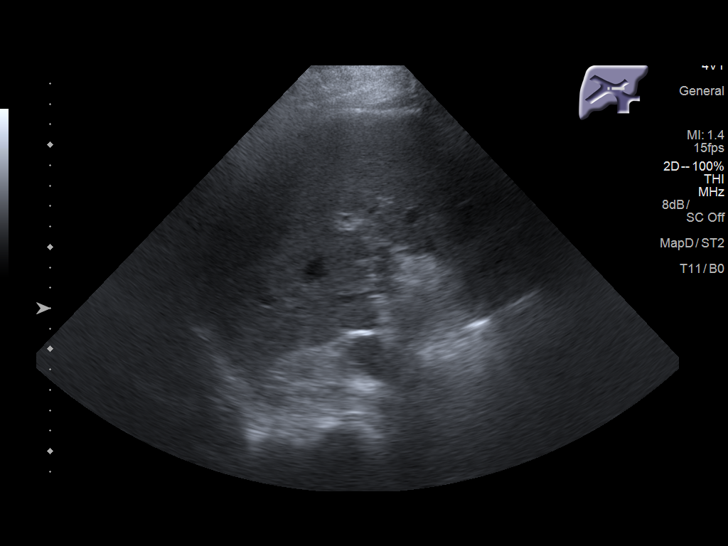
[im 21/33]
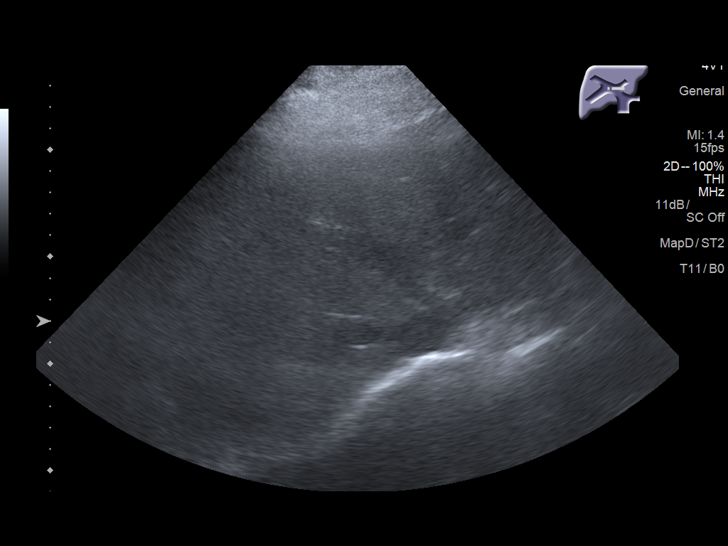
[im 22/33]
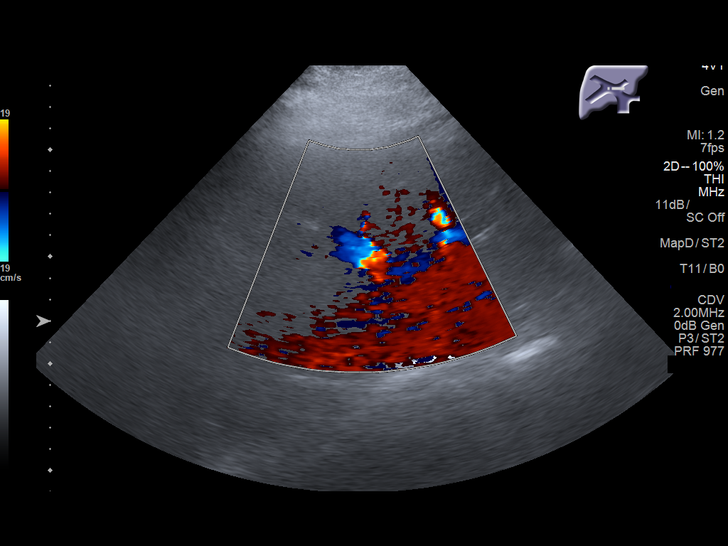
[im 25/33]
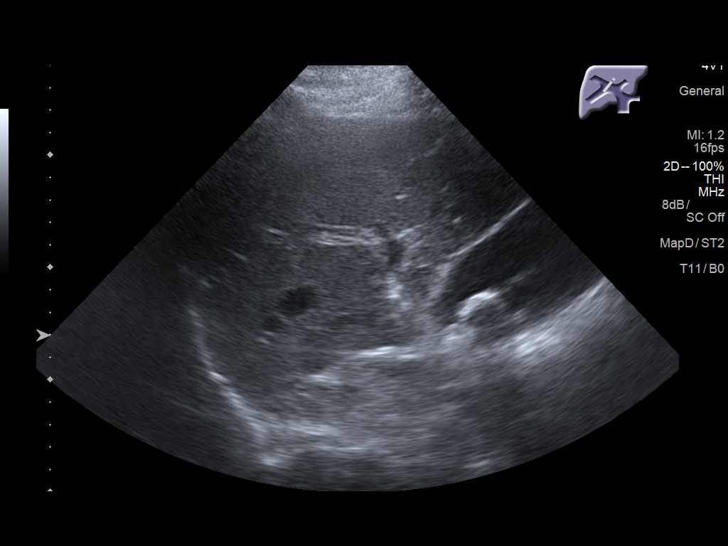
[im 27/33]
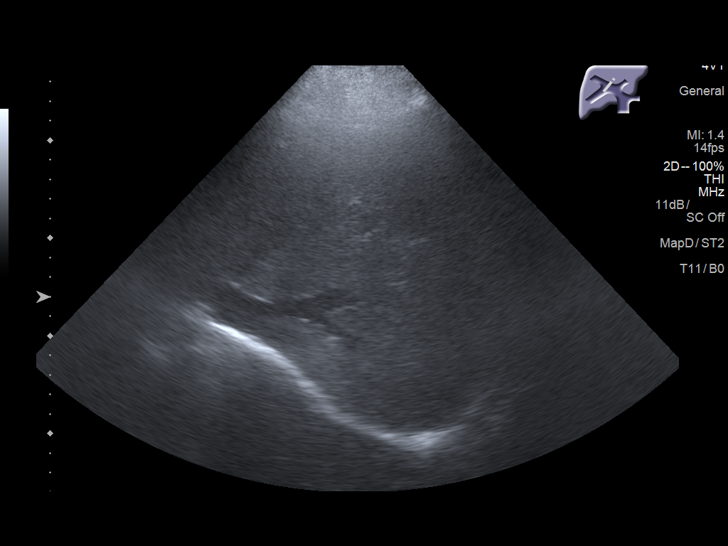
[im 30/33]
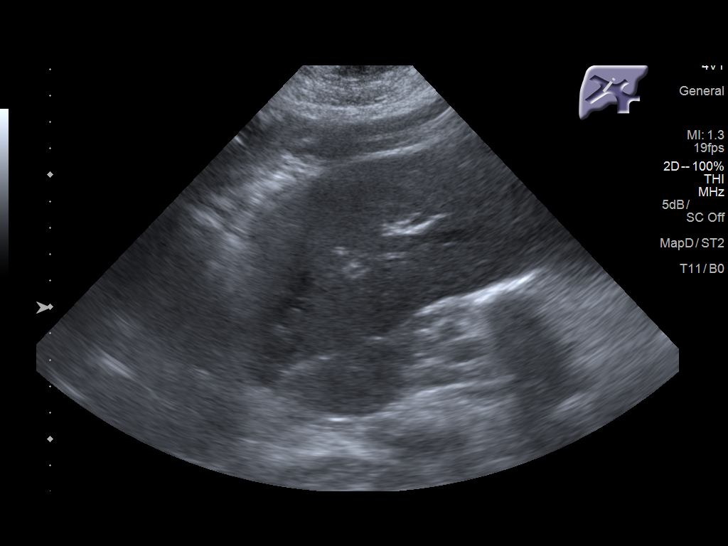
[im 33/33]
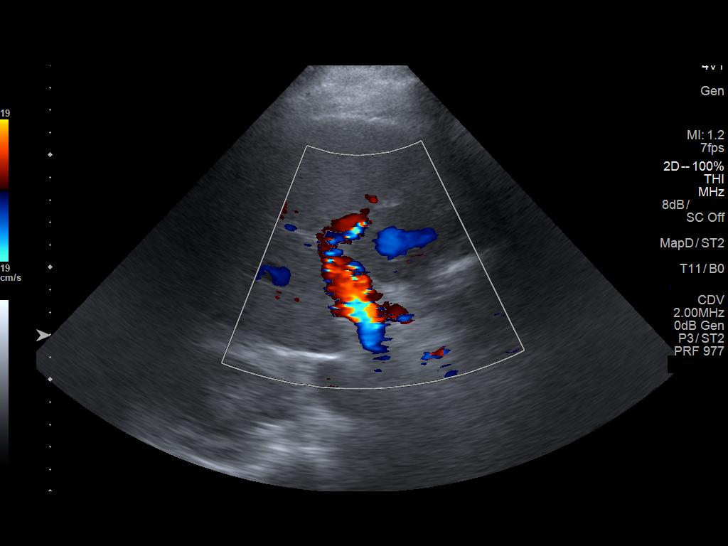

[14 of 25 positions shown; findings below may reference images not displayed]

FINDINGS: Gallbladder:

Stones are seen dependently within the gallbladder. The gallbladder
wall is mildly thickened, with trace pericholecystic fluid and a
positive ultrasonographic Murphy's sign, likely reflecting acute
cholecystitis.

Common bile duct:

Diameter: 0.5 cm, within normal limits in caliber.

Liver:

No focal lesion identified. Within normal limits in parenchymal
echogenicity. The liver appears mildly enlarged.
IMPRESSION: 1. Mild gallbladder wall thickening, with trace pericholecystic
fluid and positive ultrasonographic Murphy's sign, concerning for
acute cholecystitis. Cholelithiasis noted.
2. Suggestion of mild hepatomegaly.

## 2022-06-26 ENCOUNTER — Other Ambulatory Visit (HOSPITAL_BASED_OUTPATIENT_CLINIC_OR_DEPARTMENT_OTHER): Payer: Self-pay

## 2022-06-26 MED ORDER — WEGOVY 0.25 MG/0.5ML ~~LOC~~ SOAJ
SUBCUTANEOUS | 0 refills | Status: AC
  Filled 2022-06-26: qty 2, 28d supply, fill #0

## 2022-06-27 ENCOUNTER — Other Ambulatory Visit (HOSPITAL_BASED_OUTPATIENT_CLINIC_OR_DEPARTMENT_OTHER): Payer: Self-pay
